# Patient Record
Sex: Female | Born: 1970 | Race: Black or African American | Hispanic: No | Marital: Married | Smoking: Never smoker
Health system: Southern US, Community
[De-identification: ages and names within clinical notes are randomized; demographics above are authoritative.]

## PROBLEM LIST (undated history)

## (undated) DIAGNOSIS — Z789 Other specified health status: Secondary | ICD-10-CM

## (undated) HISTORY — PX: ORTHOPEDIC SURGERY: SHX850

---

## 1999-01-06 ENCOUNTER — Emergency Department (HOSPITAL_COMMUNITY): Admission: EM | Admit: 1999-01-06 | Discharge: 1999-01-07 | Payer: Self-pay | Admitting: Emergency Medicine

## 2001-02-09 ENCOUNTER — Other Ambulatory Visit: Admission: RE | Admit: 2001-02-09 | Discharge: 2001-02-09 | Payer: Self-pay | Admitting: Obstetrics & Gynecology

## 2001-04-10 ENCOUNTER — Emergency Department (HOSPITAL_COMMUNITY): Admission: EM | Admit: 2001-04-10 | Discharge: 2001-04-11 | Payer: Self-pay | Admitting: Emergency Medicine

## 2001-08-25 ENCOUNTER — Inpatient Hospital Stay (HOSPITAL_COMMUNITY): Admission: AD | Admit: 2001-08-25 | Discharge: 2001-08-27 | Payer: Self-pay | Admitting: Obstetrics & Gynecology

## 2002-05-31 ENCOUNTER — Other Ambulatory Visit: Admission: RE | Admit: 2002-05-31 | Discharge: 2002-05-31 | Payer: Self-pay | Admitting: Obstetrics & Gynecology

## 2004-03-31 ENCOUNTER — Encounter: Admission: RE | Admit: 2004-03-31 | Discharge: 2004-03-31 | Payer: Self-pay | Admitting: Family Medicine

## 2005-01-13 ENCOUNTER — Inpatient Hospital Stay (HOSPITAL_COMMUNITY): Admission: AD | Admit: 2005-01-13 | Discharge: 2005-01-13 | Payer: Self-pay | Admitting: Obstetrics & Gynecology

## 2005-01-14 ENCOUNTER — Inpatient Hospital Stay (HOSPITAL_COMMUNITY): Admission: AD | Admit: 2005-01-14 | Discharge: 2005-01-16 | Payer: Self-pay | Admitting: Obstetrics and Gynecology

## 2005-01-14 ENCOUNTER — Encounter (INDEPENDENT_AMBULATORY_CARE_PROVIDER_SITE_OTHER): Payer: Self-pay | Admitting: Specialist

## 2005-09-07 ENCOUNTER — Encounter: Admission: RE | Admit: 2005-09-07 | Discharge: 2005-09-07 | Payer: Self-pay | Admitting: Family Medicine

## 2008-10-03 ENCOUNTER — Inpatient Hospital Stay (HOSPITAL_COMMUNITY): Admission: AD | Admit: 2008-10-03 | Discharge: 2008-10-05 | Payer: Self-pay | Admitting: Obstetrics & Gynecology

## 2010-07-04 ENCOUNTER — Emergency Department (HOSPITAL_BASED_OUTPATIENT_CLINIC_OR_DEPARTMENT_OTHER)
Admission: EM | Admit: 2010-07-04 | Discharge: 2010-07-05 | Payer: Self-pay | Source: Home / Self Care | Admitting: Emergency Medicine

## 2010-08-01 ENCOUNTER — Encounter: Payer: Self-pay | Admitting: Obstetrics & Gynecology

## 2010-10-22 LAB — CBC
HCT: 33.7 % — ABNORMAL LOW (ref 36.0–46.0)
HCT: 39.3 % (ref 36.0–46.0)
Hemoglobin: 11.1 g/dL — ABNORMAL LOW (ref 12.0–15.0)
Hemoglobin: 13 g/dL (ref 12.0–15.0)
MCHC: 32.9 g/dL (ref 30.0–36.0)
MCHC: 33.1 g/dL (ref 30.0–36.0)
MCV: 101.3 fL — ABNORMAL HIGH (ref 78.0–100.0)
Platelets: 214 10*3/uL (ref 150–400)
RBC: 3.89 MIL/uL (ref 3.87–5.11)
RDW: 14 % (ref 11.5–15.5)
RDW: 14.3 % (ref 11.5–15.5)
WBC: 12.2 10*3/uL — ABNORMAL HIGH (ref 4.0–10.5)

## 2010-11-24 NOTE — H&P (Signed)
Christine Archer, Christine Archer                ACCOUNT NO.:  [id number]   MEDICAL RECORD NO.:  [id number]          PATIENT TYPE:  INP   LOCATION:  9107                          FACILITY:  [facility]   PHYSICIAN:  Lenoard Aden, M.D.DATE OF BIRTH:  08-26-1970   DATE OF ADMISSION:  10/03/2008  DATE OF DISCHARGE:                              HISTORY & PHYSICAL   CHIEF COMPLAINT:  Labor.   HISTORY OF PRESENT ILLNESS:  She is a 40 year old African American  female G3, P2 at term who presents in active labor.  Medications are  prenatal vitamins and Valtrex for suppression.  She denies herpes  outbreaks.  She is a nonsmoker, nondrinker.  Denies domestic or physical  violence.   FAMILY HISTORY:  Diabetes, hypertension, breast cancer, lymphoma,  rheumatoid arthritis, lung cancer, CVA, history of two uncomplicated  vaginal deliveries.   PHYSICAL EXAMINATION:  GENERAL:  She is uncomfortable-appearing white  female in acute distress.  HEENT:  Normal.  LUNGS:  Clear.  HEART:  Regular rhythm.  ABDOMEN:  Soft, gravid, and nontender.  No CVA tenderness noted.  Cervix  is 10 cm, 100%, +2.  The patient precipitously delivering outside of the  room.  EXTREMITIES:  No cords.  NEUROLOGIC:  Nonfocal.  SKIN:  Intact.  As noted fetal heart tones are visualized to be within  the normal range during the course of the delivery.   IMPRESSION:  A 39-week intrauterine pregnancy and second stage of labor.   PLAN:  Anticipated attempts at vaginal delivery.      Lenoard Aden, M.D.  Electronically Signed     RJT/MEDQ  D:  10/03/2008  T:  10/04/2008  Job:  [id number]

## 2010-11-27 NOTE — H&P (Signed)
NAMEBRITT, Christine Archer                ACCOUNT NO.:  [id number]   MEDICAL RECORD NO.:  [id number]          PATIENT TYPE:  MAT   LOCATION:  MATC                          FACILITY:  [facility]   PHYSICIAN:  Lenoard Aden, M.D.DATE OF BIRTH:  23-Jun-1971   DATE OF ADMISSION:  01/13/2005  DATE OF DISCHARGE:                                HISTORY & PHYSICAL   CHIEF COMPLAINT:  Labor.   HISTORY OF PRESENT ILLNESS:  The patient is a 40 year old African-American  female, G5, P1, EDD January 16, 2005, at 39-5/7 weeks with spontaneous rupture  of membranes with leakage of fluids since approximately 11 o'clock this  morning.   ALLERGIES:  She has no known drug allergies.   MEDICATIONS:  Prenatal vitamins.   PAST OB HISTORY:  She has history of uncomplicated 7 pound 7 ounce female born  in 2003, TAB in 1998, TAB second trimester 1999, history of a TAB in 2001.   PAST MEDICAL HISTORY:  She has no other medical or surgical  hospitalizations.   FAMILY HISTORY:  She has a family history of insulin dependent diabetes,  cerebrovascular disease, lymphoma, and breast cancer.   LABORATORY DATA:  Prenatal lab data:  Blood type O positive.  Rh antibody  negative.  Rubella immune.  Hepatitis/HIV negative.  GBS negative.   PHYSICAL EXAMINATION:  GENERAL:  She is a well-developed, well-nourished,  African-American female in no acute distress.  HEENT:  Normal.  LUNGS:  Clear.  HEART:  Regular rhythm.  ABDOMEN:  Soft, gravid, nontender.  Estimated fetal weight 7-1/2 pounds.  CERVIX:  Cervix is 4 cm dilated, 50%, soft, vertex, -1, clear amniotic fluid  noted.  EXTREMITIES:  No cords.  NEUROLOGIC:  Nonfocal.   IMPRESSION:  Term intrauterine pregnancy in early labor, spontaneous rupture  of membranes, GBS negative.   PLAN:  Admit to [facility].  Anticipated attempts at vaginal delivery.  Epidural as needed.       RJT/MEDQ  D:  01/14/2005  T:  01/14/2005  Job:  [id number]   cc:   [facility]

## 2010-11-27 NOTE — H&P (Signed)
[facility]  Patient:    Christine Archer, Christine Archer Visit Number: [id number] MRN: [id number]          Service Type: OBS Location: 910B 9153 [id number] Attending Physician:  Genia Del Dictated by:   Genia Del, M.D. Admit Date:  08/25/2001                           History and Physical  DATE OF BIRTH:  1970/12/29.  BRIEF HISTORY:  The patient is a 40 year old G4, P0, A3, last menstrual period Nov 23, 2000, for an expected date of delivery August 31, 2001, at 39 weeks and 1 day gestation.  REASON FOR ADMISSION:  Spontaneous labor with regular uterine contractions every 3 to 4 minutes x 8:30 this morning.  HISTORY OF PRESENT ILLNESS:  INcreased uterine contractions x yesterday with regular uterine contractions of moderate intensity x 8:30 on August 25, 2001.  No fluid leak, no vaginal bleeding.  Good fetal movement.  No PIH symptoms.  PAST MEDICAL HISTORY:  Negative.  PAST SURGICAL HISTORY:  Positive for therapeutic abortions x 3.  No complications.  OBSTETRICAL HISTORY:  Therapeutic abortions x 3 in 1998, 1999, and 2001 at 8 weeks, 20 weeks and 13 weeks.  FAMILY HISTORY:  Positive for diabetes and breast cancer.  ALLERGIES:  No known drug allergies.  MEDICATIONS: 1. Prenatal vitamins.  HISTORY OF PRESENT PREGNANCY:  First trimester:  Mild spotting.  Labs on first trimester:  hemoglobin 11.9, platelets 315, blood type Rh 0 positive.  Rh antibodies negative.  Sickle cell trait negative.  RPR nonreactive.  Rubella titer is not immune.  HBsAg negative.  HIV nonreactive.  Gonorrhea and chlamydia negative.  At 16 weeks patient had triple test which was within normal limits.  At 19 plus weeks ultrasound revealed anatomy was within normal limits, placenta was fundal, posterior, normal.  Amniotic fluid index within normal limits.  Cervical length 4 cm closed.  In the third trimester 1 hour gtt. was within normal limits.  Group B streptococcus  was negative at 35+ weeks.  Blood pressures remained normal and uterine height appropriate for gestational age.  REVIEW OF SYSTEMS:  Constitutional negative.  HEENT:  Negative.  Respiratory, cardiovascular:  Negative.  Urologic/GI:  Negative.  Endocrine/Neurologic: Negative.  PHYSICAL EXAMINATION:  GENERAL:  Pain with uterine contractions.  VITAL SIGNS:  Stable with blood pressure at 115/73.  Pulse 82, temperature 97.7, respiratory rate 20.  LUNGS:  Clear bilaterally.  HEART:  Regular cardiac rhythm, no murmur.  ABDOMEN:  Gravid.  Uterus soft between contractions.  VAGINAL EXAMINATION:   At maternity admission was 2 cm, 70% percent effaced. Vertex minus 2, membranes intact.  Lower limbs normal.  Monitoring fetal heart rate 130-135 per minute with accelerations positive.   No decelerations. Uterine contractions every 2 to 4 minutes lasting about 60 seconds.  Good intensity.  IMPRESSION:  G4, P0, A3, at 39 plus weeks gestation in spontaneous labor. Fetal well-being reassuring.  Group B streptococcus negative.  PLAN:  Admit to labor and delivery monitoring.  Expectant management towards probable vaginal delivery. Dictated by:   Genia Del, M.D. Attending Physician:  Genia Del DD:  08/25/01 TD:  08/25/01 Job: [id number]

## 2010-11-27 NOTE — Op Note (Signed)
[name], Christine Archer                ACCOUNT NO.:  [id number]   MEDICAL RECORD NO.:  [id number]          PATIENT TYPE:  INP   LOCATION:  9171                          FACILITY:  [facility]   PHYSICIAN:  Richardean Sale, M.D.   DATE OF BIRTH:  Dec 08, 1970   DATE OF PROCEDURE:  01/14/2005  DATE OF DISCHARGE:                                 OPERATIVE REPORT   PREOPERATIVE DIAGNOSIS:  Nonreassuring fetal tracing, second stage.   POSTOPERATIVE DIAGNOSIS:  Nonreassuring fetal tracing, second stage.   PROCEDURE:  Low vacuum assisted vaginal delivery.   SURGEON:  Richardean Sale, M.D.   ANESTHESIA:  Epidural.   COMPLICATIONS:  None.   ESTIMATED BLOOD LOSS:  200 mL.   FINDINGS:  Viable female infant right occipitoanterior with Apgars of 9 and 9.  Arterial cord pH of 7.18.   INDICATIONS FOR PROCEDURE:  This is a 40 year old gravida 5, para 1-0-3-1,  female who presented at 39+ weeks gestation with spontaneous rupture of  membranes.  The patient was admitted at 4 cm dilated, received only 1  milliunit of Pitocin, and developed an adequate contraction pattern.  The  patient's fetal heart rate tracing on admission was in the 160's to 170's  with accelerations in the 180's to 190's.  She had a temperature of 100.  She was therefore started on intravenous antibiotics for presumed  chorioamnionitis.  Group B Strep was known negative.  The patient made  adequate progress in labor.  At 8 cm, the fetal heart rate tracing was in  the 160's with the development of moderate variable decelerations.  The  patient progressed quickly to an anterior lip with a +1 station.  The  anterior lip was able to be reduced with one contraction.  Fetal heart rate  tracing remained at a baseline of 160's.  She did have an episode of  bradycardia down to the 70's.  Given this finding, the decision was made to  proceed with vacuum assisted vaginal delivery for nonreassuring fetal heart  rate tracing.  Prior to the procedure,  the risks, benefits, and alternatives  were reviewed with the patient and her husband in detail and verbal consent  was obtained.   DESCRIPTION OF PROCEDURE:  The patient's Foley catheter had been removed  just prior to the onset of pushing.  A vaginal examination was performed  which revealed the patient was complete, complete, at +2 station, right  occipitoanterior.  The Kiwi vacuum was then applied over the flexion point  and vacuum pressure was applied to the green safety zone with the next  contraction.  The vertex was delivered to crowning.  The vacuum was  discontinued.  A midline episiotomy was then made and the head was then  delivered atraumatically.  Nose and mouth were suctioned with the bulb.  Shoulders and body were delivered within 5 to 10 seconds and the infant was  delivered to the mother's abdomen with a vigorous cry.  There was no nuchal  cord, but a short umbilical cord was noted.  The cord was clamped and cut.  The placenta was then delivered  spontaneously and intact.  The uterus was  explored.  There were no retained products .  Cervix was visualized and was  intact.  Second degree midline  episiotomy was repaired with 3-0 Vicryl in the standard fashion and was  hemostatic.  Estimated blood loss was 200 mL.  Arterial cord pH was  obtained.  The patient tolerated the procedure very well.  She and the  infant are doing well at the time of this dictation.  There were no  complications.  All sponge and instrument counts were correct.       JW/MEDQ  D:  01/14/2005  T:  01/14/2005  Job:  [id number]

## 2010-11-27 NOTE — Consult Note (Signed)
Christine Archer, Christine Archer                ACCOUNT NO.:  [id number]   MEDICAL RECORD NO.:  [id number]           PATIENT TYPE:   LOCATION:                                [facility]ACILITY:  [facility]   PHYSICIAN:  Lenoard Aden, M.D.DATE OF BIRTH:  Jul 27, 1970   DATE OF CONSULTATION:  01/13/2005  DATE OF DISCHARGE:                                   CONSULTATION   CONSULTING PHYSICIAN:  Lenoard Aden, M.D.   CHIEF COMPLAINT:  Rule out rupture of membranes.   HISTORY OF PRESENT ILLNESS:  The patient is a 40 year old, African American  female, G5, P1-0-3-1, EDD of January 16, 2005, at 39+ weeks, who presents with  questionable leakage of fluid today.   She has no known drug allergies.   MEDICATIONS:  Prenatal vitamins.   History of TAB x 3 and spontaneous vaginal delivery x 1.  Pregnancy course  uncomplicated.   FAMILY HISTORY:  Remarkable for breast cancer, lymphoma, cerebrovascular  accident, and insulin-dependent diabetes.   She is a nonsmoker, nondrinker.  Denies domestic or physical violence.   Blood type O positive.  Rubella immune.  Hepatitis, HIV negative.  Triple  screen normal.  Glucose test normal.   PHYSICAL EXAMINATION:  GENERAL:  She is a well-developed, well-nourished  white female in no acute distress.  HEENT:  Normal.  LUNGS:  Clear.  HEART:  Regular rate and rhythm.  ABDOMEN:  Soft, gravid, nontender.  PELVIC:  Cervix is 3-cm, 50%, vertex -1.  Speculum exam negative.  __________  negative.  Membranes are palpable.   IMPRESSION:  1.  A 39-week obstetrical patient.  2.  No evidence of spontaneous rupture of membranes.   PLAN:  1.  Discharge home.  2.  Labor warnings given.  3.  Anticipate followup.  4.  If further leakage of fluid is noted the patient to return for re-      evaluation.       RJT/MEDQ  D:  01/13/2005  T:  01/13/2005  Job:  [id number]

## 2011-05-27 ENCOUNTER — Emergency Department (HOSPITAL_COMMUNITY): Payer: 59 | Admitting: Anesthesiology

## 2011-05-27 ENCOUNTER — Emergency Department (HOSPITAL_COMMUNITY): Payer: 59

## 2011-05-27 ENCOUNTER — Encounter (HOSPITAL_COMMUNITY): Payer: Self-pay | Admitting: Anesthesiology

## 2011-05-27 ENCOUNTER — Encounter (HOSPITAL_COMMUNITY)
Admission: EM | Disposition: A | Discharge status: Home Health | Payer: Self-pay | Source: Ambulatory Visit | Attending: Orthopedic Surgery

## 2011-05-27 ENCOUNTER — Encounter: Payer: Self-pay | Admitting: Emergency Medicine

## 2011-05-27 ENCOUNTER — Inpatient Hospital Stay (HOSPITAL_COMMUNITY)
Admission: EM | Admit: 2011-05-27 | Discharge: 2011-05-29 | DRG: 494 | Disposition: A | Discharge status: Home Health | Payer: 59 | Source: Ambulatory Visit | Attending: Orthopedic Surgery | Admitting: Orthopedic Surgery

## 2011-05-27 DIAGNOSIS — S82202B Unspecified fracture of shaft of left tibia, initial encounter for open fracture type I or II: Secondary | ICD-10-CM | POA: Diagnosis present

## 2011-05-27 DIAGNOSIS — S82402B Unspecified fracture of shaft of left fibula, initial encounter for open fracture type I or II: Secondary | ICD-10-CM

## 2011-05-27 DIAGNOSIS — S82209B Unspecified fracture of shaft of unspecified tibia, initial encounter for open fracture type I or II: Principal | ICD-10-CM | POA: Diagnosis present

## 2011-05-27 DIAGNOSIS — Y92009 Unspecified place in unspecified non-institutional (private) residence as the place of occurrence of the external cause: Secondary | ICD-10-CM

## 2011-05-27 DIAGNOSIS — Y998 Other external cause status: Secondary | ICD-10-CM

## 2011-05-27 HISTORY — PX: TIBIA IM NAIL INSERTION: SHX2516

## 2011-05-27 HISTORY — PX: I & D EXTREMITY: SHX5045

## 2011-05-27 HISTORY — DX: Other specified health status: Z78.9

## 2011-05-27 LAB — BASIC METABOLIC PANEL
GFR calc Af Amer: >90 mL/min (ref >90)
GFR calc non Af Amer: >90 mL/min (ref >90)
Potassium: 4.3 mEq/L (ref 3.5–5.1)
Sodium: 138 mEq/L (ref 135–145)

## 2011-05-27 LAB — CBC
MCH: 30.9 pg (ref 26.0–34.0)
MCHC: 32.7 g/dL (ref 30.0–36.0)
Platelets: 234 10*3/uL (ref 150–400)
RBC: 3.92 MIL/uL (ref 3.87–5.11)

## 2011-05-27 LAB — APTT: aPTT: 26 seconds (ref 24–37)

## 2011-05-27 LAB — PROTIME-INR: INR: 1.07 (ref 0.00–1.49)

## 2011-05-27 LAB — DIFFERENTIAL
Basophils Relative: 0 % (ref 0–1)
Eosinophils Absolute: 0 10*3/uL (ref 0.0–0.7)
Neutrophils Relative %: 89 % — ABNORMAL HIGH (ref 43–77)

## 2011-05-27 SURGERY — INSERTION, INTRAMEDULLARY ROD, TIBIA
Anesthesia: General | Site: Leg Lower | Laterality: Left | Wound class: Dirty or Infected

## 2011-05-27 MED ORDER — PROMETHAZINE HCL 25 MG PO TABS: 25.0000 mg | ORAL_TABLET | Freq: Four times a day (QID) | ORAL | Status: AC | PRN

## 2011-05-27 MED ORDER — METHOCARBAMOL 500 MG PO TABS: 500.0000 mg | ORAL_TABLET | Freq: Four times a day (QID) | ORAL | Status: DC

## 2011-05-27 MED ORDER — FENTANYL CITRATE 0.05 MG/ML IJ SOLN
INTRAMUSCULAR | Status: DC | PRN
  Administered 2011-05-27 (×2): 50 ug via INTRAVENOUS
  Administered 2011-05-27: 100 ug via INTRAVENOUS
  Administered 2011-05-27: 50 ug via INTRAVENOUS

## 2011-05-27 MED ORDER — ONDANSETRON HCL 4 MG/2ML IJ SOLN
4.0000 mg | Freq: Once | INTRAMUSCULAR | Status: AC
  Administered 2011-05-27: 4 mg via INTRAVENOUS
  Filled 2011-05-27: qty 2

## 2011-05-27 MED ORDER — ENOXAPARIN SODIUM 30 MG/0.3ML ~~LOC~~ SOLN: 40.0000 mg | SUBCUTANEOUS | Status: DC

## 2011-05-27 MED ORDER — METHOCARBAMOL 500 MG PO TABS: 500.0000 mg | ORAL_TABLET | Freq: Four times a day (QID) | ORAL | Status: AC

## 2011-05-27 MED ORDER — ONDANSETRON HCL 4 MG/2ML IJ SOLN
INTRAMUSCULAR | Status: AC
  Administered 2011-05-27: 4 mg via INTRAVENOUS
  Filled 2011-05-27: qty 2

## 2011-05-27 MED ORDER — ONDANSETRON HCL 4 MG/2ML IJ SOLN
INTRAMUSCULAR | Status: DC | PRN
  Administered 2011-05-27: 4 mg via INTRAVENOUS

## 2011-05-27 MED ORDER — CEFAZOLIN SODIUM 1-5 GM-% IV SOLN
1.0000 g | Freq: Once | INTRAVENOUS | Status: AC
  Administered 2011-05-27: 1 g via INTRAVENOUS

## 2011-05-27 MED ORDER — PROPOFOL 10 MG/ML IV EMUL
INTRAVENOUS | Status: DC | PRN
  Administered 2011-05-27: 170 mg via INTRAVENOUS

## 2011-05-27 MED ORDER — CEFAZOLIN SODIUM 1-5 GM-% IV SOLN
1.0000 g | Freq: Three times a day (TID) | INTRAVENOUS | Status: DC
  Administered 2011-05-28 – 2011-05-29 (×4): 1 g via INTRAVENOUS
  Filled 2011-05-27 (×7): qty 50

## 2011-05-27 MED ORDER — MIDAZOLAM HCL 5 MG/5ML IJ SOLN
INTRAMUSCULAR | Status: DC | PRN
  Administered 2011-05-27: 2 mg via INTRAVENOUS

## 2011-05-27 MED ORDER — HYDROMORPHONE HCL PF 1 MG/ML IJ SOLN
1.0000 mg | Freq: Once | INTRAMUSCULAR | Status: AC
  Administered 2011-05-27: 1 mg via INTRAVENOUS
  Filled 2011-05-27: qty 1

## 2011-05-27 MED ORDER — OXYCODONE-ACETAMINOPHEN 10-325 MG PO TABS: 1.0000 | ORAL_TABLET | Freq: Four times a day (QID) | ORAL | Status: AC | PRN

## 2011-05-27 MED ORDER — ONDANSETRON HCL 4 MG/2ML IJ SOLN
4.0000 mg | Freq: Once | INTRAMUSCULAR | Status: AC
  Administered 2011-05-27: 4 mg via INTRAVENOUS

## 2011-05-27 MED ORDER — PROMETHAZINE HCL 25 MG/ML IJ SOLN: 6.2500 mg | INTRAMUSCULAR | Status: DC | PRN

## 2011-05-27 MED ORDER — SODIUM CHLORIDE 0.9 % IR SOLN
Status: DC | PRN
  Administered 2011-05-27: 6000 mL

## 2011-05-27 MED ORDER — HYDROMORPHONE HCL PF 1 MG/ML IJ SOLN
0.2500 mg | INTRAMUSCULAR | Status: DC | PRN
  Administered 2011-05-27: 0.5 mg via INTRAVENOUS

## 2011-05-27 MED ORDER — SUCCINYLCHOLINE CHLORIDE 20 MG/ML IJ SOLN
INTRAMUSCULAR | Status: DC | PRN
  Administered 2011-05-27: 100 mg via INTRAVENOUS

## 2011-05-27 MED ORDER — CEFAZOLIN SODIUM 1-5 GM-% IV SOLN
INTRAVENOUS | Status: DC | PRN
  Administered 2011-05-27: 1 g via INTRAVENOUS

## 2011-05-27 MED ORDER — LACTATED RINGERS IV SOLN
INTRAVENOUS | Status: DC | PRN
  Administered 2011-05-27 (×2): via INTRAVENOUS

## 2011-05-27 MED ORDER — ACETAMINOPHEN 10 MG/ML IV SOLN
1000.0000 mg | Freq: Once | INTRAVENOUS | Status: AC
  Administered 2011-05-27: 1000 mg via INTRAVENOUS
  Filled 2011-05-27: qty 100

## 2011-05-27 MED ORDER — PROMETHAZINE HCL 25 MG PO TABS
25.0000 mg | ORAL_TABLET | Freq: Four times a day (QID) | ORAL | Status: DC | PRN
Start: 2011-05-27 — End: 2011-05-27

## 2011-05-27 SURGICAL SUPPLY — 75 items
APL SKNCLS STERI-STRIP NONHPOA (GAUZE/BANDAGES/DRESSINGS) ×1
BANDAGE ELASTIC 4 VELCRO ST LF (GAUZE/BANDAGES/DRESSINGS) ×2 IMPLANT
BANDAGE ELASTIC 6 VELCRO ST LF (GAUZE/BANDAGES/DRESSINGS) ×2 IMPLANT
BANDAGE ESMARK 6X9 LF (GAUZE/BANDAGES/DRESSINGS) IMPLANT
BANDAGE GAUZE ELAST BULKY 4 IN (GAUZE/BANDAGES/DRESSINGS) ×2 IMPLANT
BENZOIN TINCTURE PRP APPL 2/3 (GAUZE/BANDAGES/DRESSINGS) ×1 IMPLANT
BIT DRILL 3.8X6 NS (BIT) ×1 IMPLANT
BIT DRILL 4.4 NS (BIT) ×1 IMPLANT
BLADE SURG 15 STRL LF DISP TIS (BLADE) ×1 IMPLANT
BLADE SURG 15 STRL SS (BLADE) ×2
BLADE SURG ROTATE 9660 (MISCELLANEOUS) IMPLANT
BNDG CMPR 9X6 STRL LF SNTH (GAUZE/BANDAGES/DRESSINGS)
BNDG COHESIVE 6X5 TAN STRL LF (GAUZE/BANDAGES/DRESSINGS) ×2 IMPLANT
BNDG ESMARK 6X9 LF (GAUZE/BANDAGES/DRESSINGS)
BOOTCOVER CLEANROOM LRG (PROTECTIVE WEAR) ×4 IMPLANT
CLOSURE STERI STRIP 1/2 X4 (GAUZE/BANDAGES/DRESSINGS) ×1 IMPLANT
CLOTH BEACON ORANGE TIMEOUT ST (SAFETY) ×2 IMPLANT
COVER SURGICAL LIGHT HANDLE (MISCELLANEOUS) ×3 IMPLANT
CUFF TOURNIQUET SINGLE 34IN LL (TOURNIQUET CUFF) IMPLANT
CUFF TOURNIQUET SINGLE 44IN (TOURNIQUET CUFF) IMPLANT
DRAPE C-ARM 42X72 X-RAY (DRAPES) ×2 IMPLANT
DRAPE C-ARMOR (DRAPES) ×1 IMPLANT
DRAPE ORTHO SPLIT 77X108 STRL (DRAPES) ×6
DRAPE PROXIMA HALF (DRAPES) ×4 IMPLANT
DRAPE SURG ORHT 6 SPLT 77X108 (DRAPES) ×2 IMPLANT
DRAPE U-SHAPE 47X51 STRL (DRAPES) ×2 IMPLANT
DRSG ADAPTIC 3X8 NADH LF (GAUZE/BANDAGES/DRESSINGS) ×2 IMPLANT
DRSG PAD ABDOMINAL 8X10 ST (GAUZE/BANDAGES/DRESSINGS) ×1 IMPLANT
DURAPREP 26ML APPLICATOR (WOUND CARE) ×1 IMPLANT
ELECT REM PT RETURN 9FT ADLT (ELECTROSURGICAL) ×2
ELECTRODE REM PT RTRN 9FT ADLT (ELECTROSURGICAL) ×1 IMPLANT
FACESHIELD LNG OPTICON STERILE (SAFETY) ×2 IMPLANT
FLUID NSS /IRRIG 3000 ML XXX (IV SOLUTION) ×2 IMPLANT
GAUZE SPONGE 4X4 12PLY STRL LF (GAUZE/BANDAGES/DRESSINGS) ×1 IMPLANT
GAUZE XEROFORM 5X9 LF (GAUZE/BANDAGES/DRESSINGS) ×1 IMPLANT
GLOVE BIOGEL PI IND STRL 8 (GLOVE) ×1 IMPLANT
GLOVE BIOGEL PI INDICATOR 8 (GLOVE) ×2
GLOVE ORTHO TXT STRL SZ7.5 (GLOVE) ×4 IMPLANT
GLOVE SURG ORTHO 8.0 STRL STRW (GLOVE) ×5 IMPLANT
GOWN STRL NON-REIN LRG LVL3 (GOWN DISPOSABLE) IMPLANT
GUIDEWIRE BALL NOSE 80CM (WIRE) ×1 IMPLANT
HANDPIECE INTERPULSE COAX TIP (DISPOSABLE) ×2
KIT BASIN OR (CUSTOM PROCEDURE TRAY) ×2 IMPLANT
KIT ROOM TURNOVER OR (KITS) ×2 IMPLANT
MANIFOLD NEPTUNE II (INSTRUMENTS) ×2 IMPLANT
NAIL TIBIAL 9MMX34.5CM (Nail) ×1 IMPLANT
NS IRRIG 1000ML POUR BTL (IV SOLUTION) ×2 IMPLANT
PACK GENERAL/GYN (CUSTOM PROCEDURE TRAY) ×2 IMPLANT
PAD CAST 4YDX4 CTTN HI CHSV (CAST SUPPLIES) IMPLANT
PADDING CAST COTTON 4X4 STRL (CAST SUPPLIES) ×2
PADDING CAST COTTON 6X4 STRL (CAST SUPPLIES) ×1 IMPLANT
SCREW ACECAP 32MM (Screw) ×1 IMPLANT
SCREW ACECAP 38MM (Screw) ×1 IMPLANT
SCREW ACECAP 42MM (Screw) ×1 IMPLANT
SCREW CORTICAL 5.5 35MM (Screw) ×1 IMPLANT
SCREW PROXIMAL DEPUY (Screw) ×2 IMPLANT
SCREW PRXML FT 60X5.5XNS LF (Screw) IMPLANT
SET HNDPC FAN SPRY TIP SCT (DISPOSABLE) IMPLANT
SPONGE GAUZE 4X4 12PLY (GAUZE/BANDAGES/DRESSINGS) ×4 IMPLANT
STAPLER VISISTAT 35W (STAPLE) ×2 IMPLANT
STOCKINETTE IMPERVIOUS LG (DRAPES) ×2 IMPLANT
SUCTION FRAZIER TIP 10 FR DISP (SUCTIONS) ×2 IMPLANT
SUT ETHILON 3 0 PS 1 (SUTURE) ×2 IMPLANT
SUT MNCRL AB 4-0 PS2 18 (SUTURE) ×2 IMPLANT
SUT VIC AB 0 CT1 18XCR BRD 8 (SUTURE) ×1 IMPLANT
SUT VIC AB 0 CT1 8-18 (SUTURE) ×2
SUT VIC AB 2-0 CT1 27 (SUTURE)
SUT VIC AB 2-0 CT1 TAPERPNT 27 (SUTURE) ×1 IMPLANT
SUT VIC AB 3-0 SH 8-18 (SUTURE) ×2 IMPLANT
TOWEL OR 17X24 6PK STRL BLUE (TOWEL DISPOSABLE) ×2 IMPLANT
TOWEL OR 17X26 10 PK STRL BLUE (TOWEL DISPOSABLE) ×2 IMPLANT
TRAY FOLEY CATH 14FR (SET/KITS/TRAYS/PACK) ×1 IMPLANT
TUBE CONNECTING 12X1/4 (SUCTIONS) ×2 IMPLANT
WATER STERILE IRR 1000ML POUR (IV SOLUTION) ×2 IMPLANT
YANKAUER SUCT BULB TIP NO VENT (SUCTIONS) ×2 IMPLANT

## 2011-05-27 NOTE — ED Provider Notes (Signed)
Medical screening examination/treatment/procedure(s) were performed by non-physician practitioner and as supervising physician I was immediately available for consultation/collaboration.  Marlowe Lawes, MD 05/27/11 2126 

## 2011-05-27 NOTE — ED Notes (Signed)
Pt states pain relieved, denies needs. Aware of plans to go from here to OR. [facility] nurse Selena Batten called with report but she was told OR staff was coming to ER for pt and then she will go to 5000 after surgery.

## 2011-05-27 NOTE — ED Notes (Signed)
Report given to or nurse and she is aware needs consent form signed by pt and md.
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
## 2011-05-27 NOTE — Anesthesia Preprocedure Evaluation (Addendum)
Anesthesia Evaluation  Patient identified by MRN, date of birth, ID band Patient awake    Reviewed: Allergy & Precautions, H&P , NPO status , Patient's Chart, lab work & pertinent test results  Airway Mallampati: I TM Distance: >3 FB Neck ROM: Full  Mouth opening: Limited Mouth Opening  Dental  (+) Teeth Intact   Pulmonary    Pulmonary exam normal       Cardiovascular     Neuro/Psych    GI/Hepatic   Endo/Other    Renal/GU      Musculoskeletal   Abdominal   Peds  Hematology   Anesthesia Other Findings   Reproductive/Obstetrics                         Anesthesia Physical Anesthesia Plan  ASA: I and Emergent  Anesthesia Plan: General   Post-op Pain Management:    Induction: Intravenous  Airway Management Planned: Oral ETT  Additional Equipment:   Intra-op Plan:   Post-operative Plan: Extubation in OR  Informed Consent: I have reviewed the patients History and Physical, chart, labs and discussed the procedure including the risks, benefits and alternatives for the proposed anesthesia with the patient or authorized representative who has indicated his/her understanding and acceptance.   Dental advisory given  Plan Discussed with: CRNA, Anesthesiologist and Surgeon  Anesthesia Plan Comments:        Anesthesia Quick Evaluation

## 2011-05-27 NOTE — Transfer of Care (Signed)
Immediate Anesthesia Transfer of Care Note  Patient: Christine Archer  Procedure(s) Performed:  INTRAMEDULLARY (IM) NAIL TIBIAL; IRRIGATION AND DEBRIDEMENT EXTREMITY  Patient Location: PACU  Anesthesia Type: General  Level of Consciousness: awake and sedated  Airway & Oxygen Therapy: Patient Spontanous Breathing and Patient connected to nasal cannula oxygen  Post-op Assessment: Report given to PACU RN and Post -op Vital signs reviewed and stable  Post vital signs: Reviewed and stable  Complications: No apparent anesthesia complications

## 2011-05-27 NOTE — Discharge Instructions (Signed)
Tibial and Fibular Fracture, Adult You have a fracture (break in bone) of your tibia and fibula. The tibia is the large "shin" bone in your lower leg. It is the main bone for supporting your weight. The fibula is the bone on the outer side of your leg that makes up the bump on the outside of your ankle. These fractures are easily diagnosed with x-rays. TREATMENT  You have a simple fracture which usually will heal with minimal disability. It can be treated with simple immobilization. This means the bones can be held with a cast or splint in a favorable position until your caregiver feels it is stable enough (healed well enough) to allow weight bearing. Then you can begin range of motion exercises to regain your knee motion. HOME CARE INSTRUCTIONS   Apply ice to the injury for 15 to 20 minutes, 3 to 4 times per day while awake, for 2 days. Put the ice in a plastic bag and place a thin towel between the bag of ice and your cast.   If you have a plaster or fiberglass cast:   Do not try to scratch the skin under the cast using sharp or pointed objects.   Check the skin around the cast every day. You may put lotion on any red or sore areas.   Keep your cast dry and clean.   If you have a plaster splint:   Wear the splint as directed.   You may loosen the elastic around the splint if your toes become numb, tingle, or turn cold or blue.   Do not put pressure on any part of your cast or splint until it is fully hardened, because it may deform.   Your cast or splint can be protected during bathing with a plastic bag. Do not lower the cast or splint into water.   Use crutches as directed.   Only take over-the-counter or prescription medicines for pain, discomfort, or fever as directed by your caregiver.   Follow all instructions given to you by your caregiver, make and keep follow up appointments, and use crutches as directed.   See your caregiver as directed. It is very important to keep all  follow-up referrals and appointments in order to avoid any long-term problems with your leg and ankle including chronic pain, inability to move the ankle normally, failure of the fracture to heal and permanent disability.  SEEK IMMEDIATE MEDICAL CARE IF:  Pain is becoming worse rather than better, or if pain is uncontrolled with medications.   You have increased swelling or redness in the foot.   You begin to lose feeling in your foot or toes.   You develop a cold or blue foot or toes on the injured side.   You develop severe pain in your injured leg, especially if the pain is increased with movement of your toes.  Document Released: 03/20/2002 Document Revised: 03/10/2011 Document Reviewed: 10/14/2008 [facility] Patient Information 2012 [place], [place].

## 2011-05-27 NOTE — Preoperative (Signed)
Beta Blockers   Reason not to administer Beta Blockers:Not Applicable 

## 2011-05-27 NOTE — Anesthesia Procedure Notes (Addendum)
Performed by: Julianne Rice Z   Procedure Name: Intubation Date/Time: 05/27/2011 7:57 PM Performed by: Pricilla Holm Pre-anesthesia Checklist: Patient identified, Emergency Drugs available, Suction available, Patient being monitored and Timeout performed Patient Re-evaluated:Patient Re-evaluated prior to inductionOxygen Delivery Method: Circle System Utilized Preoxygenation: Pre-oxygenation with 100% oxygen Intubation Type: Rapid sequence Laryngoscope Size: Mac and 3 Grade View: Grade I Tube type: Oral Tube size: 7.5 mm Number of attempts: 1 Airway Equipment and Method: stylet Placement Confirmation: ETT inserted through vocal cords under direct vision,  positive ETCO2 and breath sounds checked- equal and bilateral Secured at: 23 cm Dental Injury: Teeth and Oropharynx as per pre-operative assessment

## 2011-05-27 NOTE — Brief Op Note (Signed)
05/27/2011  10:20 PM  PATIENT:  Christine Archer  40 y.o. female  PRE-OPERATIVE DIAGNOSIS:  open left tibia and fibula fracture  POST-OPERATIVE DIAGNOSIS:  Same  PROCEDURE:  INTRAMEDULLARY (IM) NAIL TIBIAL, IRRIGATION AND DEBRIDEMENT EXTREMITY  SURGEON:  Jamilex Bohnsack P  PHYSICIAN ASSISTANT: Janace Litten, OPA-C  ANESTHESIA:   General
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
## 2011-05-27 NOTE — ED Notes (Signed)
Pain down to 3/10

## 2011-05-27 NOTE — Anesthesia Postprocedure Evaluation (Signed)
  Anesthesia Post-op Note  Patient: Christine Archer  Procedure(s) Performed:  INTRAMEDULLARY (IM) NAIL TIBIAL; IRRIGATION AND DEBRIDEMENT EXTREMITY  Patient Location: PACU  Anesthesia Type: General  Level of Consciousness: awake, alert  and oriented  Airway and Oxygen Therapy: Patient Spontanous Breathing  Post-op Pain: mild  Post-op Assessment: Post-op Vital signs reviewed, Patient's Cardiovascular Status Stable, Respiratory Function Stable, Patent Airway, No signs of Nausea or vomiting and Pain level controlled  Post-op Vital Signs: stable  Complications: No apparent anesthesia complications

## 2011-05-28 LAB — BASIC METABOLIC PANEL
BUN: 10 mg/dL (ref 6–23)
Calcium: 8.8 mg/dL (ref 8.4–10.5)
Chloride: 101 mEq/L (ref 96–112)
Creatinine, Ser: 0.68 mg/dL (ref 0.50–1.10)
GFR calc Af Amer: >90 mL/min (ref >90)

## 2011-05-28 LAB — CBC
HCT: 30.8 % — ABNORMAL LOW (ref 36.0–46.0)
MCH: 31.1 pg (ref 26.0–34.0)
MCV: 93.1 fL (ref 78.0–100.0)
RDW: 12.5 % (ref 11.5–15.5)
WBC: 12.2 10*3/uL — ABNORMAL HIGH (ref 4.0–10.5)

## 2011-05-28 MED ORDER — ONDANSETRON HCL 4 MG/2ML IJ SOLN: 4.0000 mg | Freq: Four times a day (QID) | INTRAMUSCULAR | Status: DC | PRN

## 2011-05-28 MED ORDER — METOCLOPRAMIDE HCL 5 MG/ML IJ SOLN
5.0000 mg | Freq: Three times a day (TID) | INTRAMUSCULAR | Status: DC | PRN
  Filled 2011-05-28: qty 2

## 2011-05-28 MED ORDER — FLEET ENEMA 7-19 GM/118ML RE ENEM: 1.0000 | ENEMA | Freq: Every day | RECTAL | Status: DC | PRN

## 2011-05-28 MED ORDER — OXYCODONE-ACETAMINOPHEN 5-325 MG PO TABS
1.0000 | ORAL_TABLET | ORAL | Status: DC | PRN
  Administered 2011-05-28 – 2011-05-29 (×4): 2 via ORAL
  Filled 2011-05-28: qty 2
  Filled 2011-05-28 (×2): qty 1
  Filled 2011-05-28 (×2): qty 2

## 2011-05-28 MED ORDER — POTASSIUM CHLORIDE IN NACL 20-0.45 MEQ/L-% IV SOLN
INTRAVENOUS | Status: DC
  Administered 2011-05-28: 02:00:00 via INTRAVENOUS
  Filled 2011-05-28 (×2): qty 1000

## 2011-05-28 MED ORDER — SENNOSIDES-DOCUSATE SODIUM 8.6-50 MG PO TABS: 1.0000 | ORAL_TABLET | Freq: Every evening | ORAL | Status: DC | PRN

## 2011-05-28 MED ORDER — OXYCODONE-ACETAMINOPHEN 5-325 MG PO TABS: 1.0000 | ORAL_TABLET | ORAL | Status: DC | PRN

## 2011-05-28 MED ORDER — METHOCARBAMOL 100 MG/ML IJ SOLN
500.0000 mg | Freq: Four times a day (QID) | INTRAVENOUS | Status: DC | PRN
  Filled 2011-05-28: qty 5

## 2011-05-28 MED ORDER — POLYETHYLENE GLYCOL 3350 17 G PO PACK
17.0000 g | PACK | Freq: Every day | ORAL | Status: DC | PRN
  Filled 2011-05-28: qty 1

## 2011-05-28 MED ORDER — BISACODYL 10 MG RE SUPP: 10.0000 mg | Freq: Every day | RECTAL | Status: DC | PRN

## 2011-05-28 MED ORDER — METHOCARBAMOL 500 MG PO TABS
500.0000 mg | ORAL_TABLET | Freq: Four times a day (QID) | ORAL | Status: DC | PRN
Start: 2011-05-28 — End: 2011-05-29

## 2011-05-28 MED ORDER — ACETAMINOPHEN 325 MG PO TABS: 650.0000 mg | ORAL_TABLET | Freq: Four times a day (QID) | ORAL | Status: DC | PRN

## 2011-05-28 MED ORDER — METOCLOPRAMIDE HCL 10 MG PO TABS
5.0000 mg | ORAL_TABLET | Freq: Three times a day (TID) | ORAL | Status: DC | PRN
  Administered 2011-05-28: 10 mg via ORAL
  Filled 2011-05-28: qty 1

## 2011-05-28 MED ORDER — MAGNESIUM HYDROXIDE 400 MG/5ML PO SUSP: 30.0000 mL | Freq: Two times a day (BID) | ORAL | Status: DC | PRN

## 2011-05-28 MED ORDER — HYDROMORPHONE HCL PF 1 MG/ML IJ SOLN
INTRAMUSCULAR | Status: AC
  Administered 2011-05-28: 1 mg via INTRAVENOUS
  Filled 2011-05-28: qty 1

## 2011-05-28 MED ORDER — POTASSIUM CHLORIDE IN NACL 20-0.45 MEQ/L-% IV SOLN
INTRAVENOUS | Status: DC
  Filled 2011-05-28 (×3): qty 1000

## 2011-05-28 MED ORDER — PHENOL 1.4 % MT LIQD
1.0000 | OROMUCOSAL | Status: DC | PRN
  Filled 2011-05-28: qty 177

## 2011-05-28 MED ORDER — DOCUSATE SODIUM 100 MG PO CAPS
100.0000 mg | ORAL_CAPSULE | Freq: Two times a day (BID) | ORAL | Status: DC
  Administered 2011-05-28 – 2011-05-29 (×4): 100 mg via ORAL
  Filled 2011-05-28 (×4): qty 1

## 2011-05-28 MED ORDER — ENOXAPARIN SODIUM 40 MG/0.4ML ~~LOC~~ SOLN
40.0000 mg | SUBCUTANEOUS | Status: DC
  Administered 2011-05-28 – 2011-05-29 (×2): 40 mg via SUBCUTANEOUS
  Filled 2011-05-28 (×3): qty 0.4

## 2011-05-28 MED ORDER — CEPHALEXIN 500 MG PO CAPS: 500.0000 mg | ORAL_CAPSULE | Freq: Four times a day (QID) | ORAL | Status: AC

## 2011-05-28 MED ORDER — WHITE PETROLATUM GEL
Status: AC
  Filled 2011-05-28: qty 5

## 2011-05-28 MED ORDER — ACETAMINOPHEN 650 MG RE SUPP: 650.0000 mg | Freq: Four times a day (QID) | RECTAL | Status: DC | PRN

## 2011-05-28 MED ORDER — MENTHOL 3 MG MT LOZG: 1.0000 | LOZENGE | OROMUCOSAL | Status: DC | PRN

## 2011-05-28 MED ORDER — HYDROMORPHONE HCL PF 1 MG/ML IJ SOLN
1.0000 mg | INTRAMUSCULAR | Status: DC | PRN
  Administered 2011-05-28 (×2): 1 mg via INTRAVENOUS

## 2011-05-28 MED ORDER — BISACODYL 5 MG PO TBEC: 10.0000 mg | DELAYED_RELEASE_TABLET | Freq: Every day | ORAL | Status: DC | PRN

## 2011-05-28 MED ORDER — ZOLPIDEM TARTRATE 5 MG PO TABS: 5.0000 mg | ORAL_TABLET | Freq: Every evening | ORAL | Status: DC | PRN

## 2011-05-28 MED ORDER — ACETAMINOPHEN 325 MG PO TABS: 325.0000 mg | ORAL_TABLET | Freq: Four times a day (QID) | ORAL | Status: DC | PRN

## 2011-05-28 NOTE — Progress Notes (Signed)
Spoke with patient regarding Home health needs and equipment. Choice offered, and entered in [facility].

## 2011-05-28 NOTE — Progress Notes (Signed)
Physical Therapy Evaluation Patient Details Name: Christine Archer MRN: [id number] DOB: 1971/02/18 Today's Date: 05/28/2011  Problem List:  Patient Active Problem List  Diagnoses  . Open fracture of left tibia and fibula    Past Medical History:  Past Medical History  Diagnosis Date  . No pertinent past medical history    Past Surgical History: No past surgical history on file.  PT Assessment/Plan/Recommendation PT Assessment Clinical Impression Statement: Pt presents with a medical diagnosis of open tib/fib fx along with the impairments/deficits listed below. Pt will benefit from skilled PT in the acute care setting in order to maximize functional mobility for a safe d/c home.  PT Recommendation/Assessment: Patient will need skilled PT in the acute care venue PT Problem List: Decreased strength;Decreased range of motion;Decreased activity tolerance;Decreased mobility;Decreased knowledge of use of DME;Decreased knowledge of precautions;Pain PT Plan PT Frequency: Min 5X/week PT Treatment/Interventions: DME instruction;Gait training;Stair training;Functional mobility training;Therapeutic exercise;Patient/family education PT Recommendation Follow Up Recommendations: Home health PT Equipment Recommended: Rolling walker with 5" wheels;Wheelchair (measurements) (WC for increased mobility distances) PT Goals  Acute Rehab PT Goals PT Goal Formulation: With patient/family Time For Goal Achievement: 7 days Pt will go Supine/Side to Sit: with modified independence PT Goal: Supine/Side to Sit - Progress: Progressing toward goal Pt will go Sit to Supine/Side: with modified independence PT Goal: Sit to Supine/Side - Progress: Progressing toward goal Pt will Transfer Sit to Stand/Stand to Sit: with modified independence PT Transfer Goal: Sit to Stand/Stand to Sit - Progress: Progressing toward goal Pt will Transfer Bed to Chair/Chair to Bed: with supervision PT Transfer Goal: Bed to Chair/Chair  to Bed - Progress: Progressing toward goal Pt will Ambulate: 51 - 150 feet;with rolling walker;with supervision PT Goal: Ambulate - Progress: Progressing toward goal Pt will Go Up / Down Stairs: 1-2 stairs;with supervision;with rolling walker PT Goal: Up/Down Stairs - Progress: Other (comment) (unassessed today) Pt will Perform Home Exercise Program: Independently PT Goal: Perform Home Exercise Program - Progress: Other (comment) (unassessed today)  PT Evaluation Precautions/Restrictions  Restrictions Weight Bearing Restrictions: Yes LLE Weight Bearing: Touchdown weight bearing Prior Functioning  Home Living Lives With: Spouse;Family Receives Help From: Family Type of Home: House Home Layout: One level Home Access: Stairs to enter Entrance Stairs-Rails: None Entrance Stairs-Number of Steps: 1 (curb) Bathroom Shower/Tub: Engineer, manufacturing systems: Standard Bathroom Accessibility: Yes How Accessible: Accessible via walker Home Adaptive Equipment: None Prior Function Level of Independence: Independent with homemaking with ambulation;Independent with gait;Independent with basic ADLs;Independent with transfers Able to Take Stairs?: Yes Driving: Yes Vocation: Unemployed Cognition Cognition Arousal/Alertness: Awake/alert Overall Cognitive Status: Appears within functional limits for tasks assessed Orientation Level: Oriented X4 Sensation/Coordination Sensation Light Touch: Appears Intact Extremity Assessment RLE Assessment RLE Assessment: Within Functional Limits LLE Assessment LLE Assessment: Exceptions to WFL LLE AROM (degrees) Overall AROM Left Lower Extremity: Deficits;Other (Comment);Due to precautions;Due to pain (Bandaging) LLE Strength LLE Overall Strength: Deficits;Due to pain;Due to precautions;Other (Comment) (Bandaging) Mobility (including Balance) Bed Mobility Bed Mobility: Yes Supine to Sit: 4: Min assist;HOB flat Supine to Sit Details (indicate cue  type and reason): Assist of LLE into sitting Sitting - Scoot to Edge of Bed: 6: Modified independent (Device/Increase time) Sitting - Scoot to Edge of Bed Details (indicate cue type and reason): VC for hand placement Transfers Transfers: Yes Sit to Stand: 4: Min assist Sit to Stand Details (indicate cue type and reason): Vc for leg placement to maintain weight bearing precautions as well for hand placement for safety  to RW Stand to Sit: 4: Min assist Stand to Sit Details: VC for hand placement. Assist for LLE assist into sitting Stand Pivot Transfers: 4: Min assist Stand Pivot Transfer Details (indicate cue type and reason): Min assist for balance during hopping into recliner. VC for sequencing. Pt able to maintain weight bearing status throughout transfer (Transfer with RW) Ambulation/Gait Ambulation/Gait: No (secondary to pain and nausea)    Exercise    End of Session PT - End of Session Equipment Utilized During Treatment: Gait belt Activity Tolerance: Patient tolerated treatment well;Patient limited by pain Patient left: in chair;with call bell in reach;with family/visitor present Nurse Communication: Mobility status for transfers;Mobility status for ambulation General Behavior During Session: [facility] for tasks performed Cognition: [facility] for tasks performed  Milana Kidney 05/28/2011, 12:40 PM  05/28/2011 Milana Kidney DPT PAGER: [phone] OFFICE: [phone]

## 2011-05-28 NOTE — Progress Notes (Signed)
Subjective: 1 Day Post-Op Procedure(s) (LRB): INTRAMEDULLARY (IM) NAIL TIBIAL (Left) IRRIGATION AND DEBRIDEMENT EXTREMITY (Left) Patient reports pain as 5 on 0-10 scale.   C/o nausea.  Objective: Vital signs in last 24 hours: Temp:  [97 F (36.1 C)-101.1 F (38.4 C)] 98.4 F (36.9 C) (11/16 0610) Pulse Rate:  [59-91] 83  (11/16 0610) Resp:  [9-16] 16  (11/16 0610) BP: (91-120)/(47-68) 103/63 mmHg (11/16 0610) SpO2:  [96 %-100 %] 99 % (11/16 0610) Weight:  [64.864 kg (143 lb)] 143 lb (64.864 kg) (11/15 0951)  Intake/Output from previous day: 11/15 0701 - 11/16 0700 In: 2570 [P.O.:120; I.V.:2300] Out: 1125 [Urine:1025; Blood:100] Intake/Output this shift:     Basename 05/28/11 0635 05/27/11 1240  HGB 10.3* 12.1    Basename 05/28/11 0635 05/27/11 1240  WBC 12.2* 17.1*  RBC 3.31* 3.92  HCT 30.8* 37.0  PLT 207 234    Basename 05/28/11 0635 05/27/11 1240  NA 134* 138  K 3.9 4.3  CL 101 101  CO2 27 27  BUN 10 13  CREATININE 0.68 0.66  GLUCOSE 109* 127*  CALCIUM 8.8 9.0    Basename 05/27/11 1240  LABPT --  INR 1.07    Neurologically intact Sensation intact distally Dorsiflexion/Plantar flexion intact Incision: dressing C/D/I Compartment soft  Assessment/Plan: 1 Day Post-Op Procedure(s) (LRB): INTRAMEDULLARY (IM) NAIL TIBIAL (Left) IRRIGATION AND DEBRIDEMENT EXTREMITY (Left)  cw pt ttwb, ancef for open fracture, dc home possible sat vs. Sun. Dc foley when able to use bedpan or bedside commode.  Kaylor Simenson P 05/28/2011, 9:16 AM

## 2011-05-29 LAB — CBC
Hemoglobin: 9.5 g/dL — ABNORMAL LOW (ref 12.0–15.0)
MCH: 31.8 pg (ref 26.0–34.0)
Platelets: 188 10*3/uL (ref 150–400)
RBC: 2.99 MIL/uL — ABNORMAL LOW (ref 3.87–5.11)
WBC: 8.3 10*3/uL (ref 4.0–10.5)

## 2011-05-29 NOTE — Progress Notes (Signed)
Orthopedic Tech Progress Note Patient Details:  Christine Archer 10-Nov-1970 [id number]  Type of Splint: Short Leg Splint Location: left leg    Gaye Pollack 05/29/2011, 10:31 AM

## 2011-05-29 NOTE — Progress Notes (Signed)
Case Management:  Contacted [facility] to make aware of pt. Discharge today.  In addition, to make referral for DME [facility] wheelchair.  Tera Mater, RN, BSN Case Manager [phone]

## 2011-05-29 NOTE — Progress Notes (Signed)
Physical Therapy Treatment Patient Details Name: Christine Archer MRN: [id number] DOB: 12/30/1970 Today's Date: 05/29/2011  PT Assessment/Plan  PT - Assessment/Plan Comments on Treatment Session: Pt progressing well. She was able to ambulate an increased distance today as well as supervision with all transfers and bed mobility, with minimal assist of LLE.  PT Plan: Discharge plan remains appropriate PT Frequency: Min 5X/week Follow Up Recommendations: Home health PT Equipment Recommended: Rolling walker with 5" wheels;Wheelchair (measurements) PT Goals  Acute Rehab PT Goals PT Goal Formulation: With patient/family Time For Goal Achievement: 7 days PT Goal: Supine/Side to Sit - Progress: Progressing toward goal PT Goal: Sit to Supine/Side - Progress: Progressing toward goal PT Transfer Goal: Sit to Stand/Stand to Sit - Progress: Progressing toward goal PT Transfer Goal: Bed to Chair/Chair to Bed - Progress: Met PT Goal: Ambulate - Progress: Progressing toward goal PT Goal: Up/Down Stairs - Progress: Progressing toward goal PT Goal: Perform Home Exercise Program - Progress: Progressing toward goal  PT Treatment Precautions/Restrictions  Restrictions Weight Bearing Restrictions: Yes LLE Weight Bearing: Touchdown weight bearing Mobility (including Balance) Bed Mobility Bed Mobility: Yes Supine to Sit: 4: Min assist;HOB flat Supine to Sit Details (indicate cue type and reason): Assist of LLE. Pt able to complete all of bed mobility otherwise independently Sitting - Scoot to Edge of Bed: 6: Modified independent (Device/Increase time) Sitting - Scoot to Edge of Bed Details (indicate cue type and reason): VC for sequencing. Transfers Transfers: Yes Sit to Stand: 5: Supervision;From bed;From chair/3-in-1 Sit to Stand Details (indicate cue type and reason): VC for hand placement. Supervision for safety Stand to Sit: 5: Supervision;To chair/3-in-1 Stand to Sit Details: VC for hand  placement onto 3-1 on toilet for safety Ambulation/Gait Ambulation/Gait: Yes Ambulation/Gait Assistance: 5: Supervision Ambulation/Gait Assistance Details (indicate cue type and reason): VC for sequencing. Pt prefers to NWB LLE and hop. Short distances. Ambulation Distance (Feet): 10 Feet (20 ft second ambulation distance) Assistive device: Rolling walker Gait Pattern: Decreased step length - right;Step-to pattern Gait velocity: Decreased gait speed (Short steps) Stairs: No    Exercise    End of Session PT - End of Session Equipment Utilized During Treatment: Gait belt Activity Tolerance: Patient tolerated treatment well Patient left: in chair;with family/visitor present;with call bell in reach Nurse Communication: Mobility status for transfers;Mobility status for ambulation General Behavior During Session: [facility] for tasks performed Cognition: [facility] for tasks performed  Milana Kidney 05/29/2011, 11:18 AM  05/29/2011 Milana Kidney DPT PAGER: [phone] OFFICE: [phone]

## 2011-05-29 NOTE — Progress Notes (Signed)
Occupational Therapy Evaluation Patient Details Name: Christine Archer MRN: [id number] DOB: [birth year] Today's Date: 05/29/2011  Problem List:  Patient Active Problem List  Diagnoses  . Open fracture of left tibia and fibula    Past Medical History:  Past Medical History  Diagnosis Date  . No pertinent past medical history    Past Surgical History: No past surgical history on file.  OT Assessment/Plan/Recommendation OT Assessment Clinical Impression Statement: Pt will benefit from OT services in the acute care setting to increase I with ADLs and to increase I with functional transfers in prep for d/c home with family. OT Recommendation/Assessment: Patient will need skilled OT in the acute care venue OT Problem List: Decreased activity tolerance;Decreased knowledge of use of DME or AE;Pain;Decreased knowledge of precautions OT Therapy Diagnosis : Acute pain OT Plan OT Frequency: Min 2X/week OT Treatment/Interventions: Self-care/ADL training;DME and/or AE instruction;Therapeutic activities;Patient/family education OT Recommendation Follow Up Recommendations: None Equipment Recommended: 3 in 1 bedside comode Individuals Consulted Consulted and Agree with Results and Recommendations: Patient OT Goals Acute Rehab OT Goals OT Goal Formulation: With patient Time For Goal Achievement: 7 days ADL Goals Pt Will Perform Grooming: with modified independence;Standing at sink ADL Goal: Grooming - Progress: Other (comment) Pt Will Perform Lower Body Bathing: with modified independence;Sit to stand from bed ADL Goal: Lower Body Bathing - Progress: Other (comment) Pt Will Perform Lower Body Dressing: with modified independence;Sit to stand from bed ADL Goal: Lower Body Dressing - Progress: Other (comment) Pt Will Transfer to Toilet: with modified independence;Ambulation;with DME;3-in-1 ADL Goal: Toilet Transfer - Progress: Other (comment) Pt Will Perform Toileting - Clothing Manipulation:  with modified independence;Standing ADL Goal: Toileting - Clothing Manipulation - Progress: Other (comment)  OT Evaluation Precautions/Restrictions  Restrictions Weight Bearing Restrictions: Yes LLE Weight Bearing: Touchdown weight bearing Prior Functioning Home Living Lives With: Spouse;Family Receives Help From: Family Type of Home: House Home Layout: One level Home Access: Stairs to enter Entrance Stairs-Rails: None Entrance Stairs-Number of Steps: 1 Bathroom Shower/Tub: Associate Professor: Yes How Accessible: Accessible via walker Home Adaptive Equipment: None Prior Function Level of Independence: Independent with homemaking with ambulation;Independent with gait;Independent with basic ADLs;Independent with transfers Able to Take Stairs?: Yes Driving: Yes Vocation: Unemployed ADL ADL Eating/Feeding: Simulated;Independent Where Assessed - Eating/Feeding: Chair Grooming: Simulated;Supervision/safety Where Assessed - Grooming: Standing at sink Upper Body Bathing: Simulated;Independent Where Assessed - Upper Body Bathing: Sitting, bed Lower Body Bathing: Simulated;Minimal assistance Where Assessed - Lower Body Bathing: Sit to stand from bed Upper Body Dressing: Simulated;Independent Where Assessed - Upper Body Dressing: Sitting, bed Lower Body Dressing: Simulated;Minimal assistance Where Assessed - Lower Body Dressing: Sit to stand from bed Toilet Transfer: Performed;Supervision/safety Toilet Transfer Method: Proofreader: Raised toilet seat with arms (or 3-in-1 over toilet) Toileting - Clothing Manipulation: Performed;Supervision/safety Where Assessed - Glass blower/designer Manipulation: Standing Toileting - Hygiene: Performed;Supervision/safety Where Assessed - Toileting Hygiene: Sit on 3-in-1 or toilet Tub/Shower Transfer: Not assessed Tub/Shower Transfer Method: Not assessed Equipment Used:  Rolling walker Vision/Perception    Cognition Cognition Arousal/Alertness: Awake/alert Overall Cognitive Status: Appears within functional limits for tasks assessed Orientation Level: Oriented X4 Sensation/Coordination   Extremity Assessment RUE Assessment RUE Assessment: Within Functional Limits LUE Assessment LUE Assessment: Within Functional Limits Mobility  Bed Mobility Bed Mobility: Yes Supine to Sit: 4: Min assist;HOB flat Supine to Sit Details (indicate cue type and reason): assist of L LE. Pt able to complete all of bed mobility otherwise indpendently. Sitting -  Scoot to [place] of Bed: 6: Modified independent (Device/Increase time) Sitting - Scoot to Edge of Bed Details (indicate cue type and reason): VC for sequencing. Transfers Transfers: Yes Sit to Stand: 5: Supervision;From bed;From chair/3-in-1 Sit to Stand Details (indicate cue type and reason): VC for hand placement. Supervision for safety. Stand to Sit: 5: Supervision;To chair/3-in-1 Stand to Sit Details: VC for hand placement onto 3-1 on toilet for safety. Exercises   End of Session OT - End of Session Equipment Utilized During Treatment: Gait belt Activity Tolerance: Patient tolerated treatment well Patient left: in chair;with call bell in reach;with family/visitor present General Behavior During Session: [facility] for tasks performed Cognition: [facility] for tasks performed   Cipriano Mile 05/29/2011, 1:06 PM  05/29/2011 Cipriano Mile OTR/L Pager [phone] Office [phone]
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
## 2011-06-01 ENCOUNTER — Encounter (HOSPITAL_COMMUNITY): Payer: Self-pay | Admitting: Orthopedic Surgery

## 2012-07-11 ENCOUNTER — Encounter (HOSPITAL_BASED_OUTPATIENT_CLINIC_OR_DEPARTMENT_OTHER): Payer: Self-pay | Admitting: *Deleted

## 2012-07-11 ENCOUNTER — Emergency Department (HOSPITAL_BASED_OUTPATIENT_CLINIC_OR_DEPARTMENT_OTHER)
Admission: EM | Admit: 2012-07-11 | Discharge: 2012-07-12 | Disposition: A | Discharge status: Home / Self Care | Payer: 59 | Attending: Emergency Medicine | Admitting: Emergency Medicine

## 2012-07-11 ENCOUNTER — Emergency Department (HOSPITAL_BASED_OUTPATIENT_CLINIC_OR_DEPARTMENT_OTHER): Payer: 59

## 2012-07-11 DIAGNOSIS — Z7901 Long term (current) use of anticoagulants: Secondary | ICD-10-CM | POA: Insufficient documentation

## 2012-07-11 DIAGNOSIS — R0789 Other chest pain: Secondary | ICD-10-CM

## 2012-07-11 DIAGNOSIS — R071 Chest pain on breathing: Secondary | ICD-10-CM | POA: Insufficient documentation

## 2012-07-11 LAB — D-DIMER, QUANTITATIVE: D-Dimer, Quant: <0.27 ug/mL-FEU (ref 0.00–0.48)

## 2012-07-11 MED ORDER — IBUPROFEN 800 MG PO TABS: 800.0000 mg | ORAL_TABLET | Freq: Three times a day (TID) | ORAL | Status: DC

## 2012-07-11 MED ORDER — HYDROCODONE-ACETAMINOPHEN 5-325 MG PO TABS: 2.0000 | ORAL_TABLET | ORAL | Status: DC | PRN

## 2012-07-11 NOTE — ED Notes (Signed)
Pt with mid to upper back pain states that she exercised earlier today and that while in shower she twisted and began to have pain. Pt states that pain is worse with deep inspiration

## 2012-07-11 NOTE — ED Provider Notes (Signed)
History     CSN: [id number]  Arrival date & time 07/11/12  1753   First MD Initiated Contact with Patient 07/11/12 2029      Chief Complaint  Patient presents with  . Back Pain    (Consider location/radiation/quality/duration/timing/severity/associated sxs/prior treatment) Patient is a 41 y.o. female presenting with back pain. The history is provided by the patient. No language interpreter was used.  Back Pain  This is a new problem. The problem occurs constantly. The problem has been gradually worsening. The pain is associated with no known injury. The pain is present in the thoracic spine. The quality of the pain is described as stabbing. The pain does not radiate. The pain is at a severity of 10/10. The pain is severe. The symptoms are aggravated by twisting. The pain is the same all the time. Pertinent negatives include no fever. She has tried nothing for the symptoms. The treatment provided no relief.    Past Medical History  Diagnosis Date  . No pertinent past medical history     Past Surgical History  Procedure Date  . Tibia im nail insertion 05/27/2011    Procedure: INTRAMEDULLARY (IM) NAIL TIBIAL;  Surgeon: Eulas Post;  Location: [facility] OR;  Service: Orthopedics;  Laterality: Left;  . I&d extremity 05/27/2011    Procedure: IRRIGATION AND DEBRIDEMENT EXTREMITY;  Surgeon: Eulas Post;  Location: [facility] OR;  Service: Orthopedics;  Laterality: Left;  . Orthopedic surgery     History reviewed. No pertinent family history.  History  Substance Use Topics  . Smoking status: Never Smoker   . Smokeless tobacco: Not on file  . Alcohol Use: No    OB History    Grav Para Term Preterm Abortions TAB SAB Ect Mult Living                  Review of Systems  Constitutional: Negative for fever.  Musculoskeletal: Positive for back pain.  All other systems reviewed and are negative.    Allergies  Review of patient's allergies indicates no known allergies.  Home  Medications   Current Outpatient Rx  Name  Route  Sig  Dispense  Refill  . ENOXAPARIN SODIUM 30 MG/0.3ML [place] SOLN   Subcutaneous   Inject 0.4 mLs (40 mg total) into the skin daily.   21 Syringe   0     BP 104/64  Pulse 80  Temp 98.2 F (36.8 C) (Oral)  Resp 18  SpO2 100%  LMP 06/10/2012  Physical Exam  Nursing note and vitals reviewed. Constitutional: She is oriented to person, place, and time. She appears well-developed and well-nourished.  HENT:  Head: Normocephalic and atraumatic.  Right Ear: External ear normal.  Left Ear: External ear normal.  Mouth/Throat: Oropharynx is clear and moist.  Eyes: Conjunctivae normal and EOM are normal. Pupils are equal, round, and reactive to light.  Neck: Normal range of motion. Neck supple.  Cardiovascular: Normal rate and normal heart sounds.   Pulmonary/Chest: Effort normal and breath sounds normal.  Abdominal: Soft.  Musculoskeletal:       Pain with lifting left arm.  No pain to palpation  Neurological: She is alert and oriented to person, place, and time. She has normal reflexes.  Skin: Skin is warm.  Psychiatric: She has a normal mood and affect.    ED Course  Procedures (including critical care time)  Labs Reviewed - No data to display Dg Chest 2 View  07/11/2012  *RADIOLOGY REPORT*  Clinical Data:  Left-sided chest pain, radiating to the back.  CHEST - 2 VIEW  Comparison: None.  Findings: The lungs are well-aerated and clear.  There is no evidence of focal opacification, pleural effusion or pneumothorax.  The heart is normal in size; the mediastinal contour is within normal limits.  No acute osseous abnormalities are seen.  IMPRESSION: No acute cardiopulmonary process seen; no displaced rib fractures identified.   Original Report Authenticated By: Tonia Ghent, M.D.      No diagnosis found.    MDM   Date: 07/11/2012  Rate: 66  Rhythm: normal sinus rhythm and sinus arrhythmia  QRS Axis: normal  Intervals: normal   ST/T Wave abnormalities: normal  Conduction Disutrbances:none  Narrative Interpretation:   Old EKG Reviewed: none available   ddimer normal      Elson Areas, [place] 07/11/12 2153  Lonia Skinner [place], [place] 07/11/12 2255

## 2012-07-11 NOTE — ED Notes (Signed)
Patient stated to me that she is having chest pain, I explained protacall for chest pain and tried to help her with gown. Patient stayed on her phone conversation at length as I waited outside door with ECG machine. I knocked on door, patient still not in gown and still on phone.

## 2012-07-11 NOTE — ED Notes (Signed)
PA at bedside giving test results and plan of care .

## 2012-07-11 NOTE — ED Provider Notes (Signed)
Medical screening examination/treatment/procedure(s) were performed by non-physician practitioner and as supervising physician I was immediately available for consultation/collaboration.   Jervon Ream, MD 07/11/12 2331 

## 2017-06-06 ENCOUNTER — Other Ambulatory Visit: Payer: Self-pay

## 2017-06-06 ENCOUNTER — Encounter (HOSPITAL_BASED_OUTPATIENT_CLINIC_OR_DEPARTMENT_OTHER): Payer: Self-pay

## 2017-06-06 ENCOUNTER — Emergency Department (HOSPITAL_BASED_OUTPATIENT_CLINIC_OR_DEPARTMENT_OTHER)
Admission: EM | Admit: 2017-06-06 | Discharge: 2017-06-07 | Disposition: A | Discharge status: Home / Self Care | Payer: 59 | Attending: Emergency Medicine | Admitting: Emergency Medicine

## 2017-06-06 DIAGNOSIS — K0889 Other specified disorders of teeth and supporting structures: Secondary | ICD-10-CM | POA: Diagnosis present

## 2017-06-06 MED ORDER — BUPIVACAINE-EPINEPHRINE (PF) 0.5% -1:200000 IJ SOLN
1.8000 mL | Freq: Once | INTRAMUSCULAR | Status: AC
  Administered 2017-06-06: 1.8 mL

## 2017-06-06 MED ORDER — BUPIVACAINE-EPINEPHRINE (PF) 0.5% -1:200000 IJ SOLN
INTRAMUSCULAR | Status: AC
  Filled 2017-06-06: qty 1.8

## 2017-06-06 NOTE — ED Notes (Signed)
ED Provider at bedside. 

## 2017-06-06 NOTE — ED Provider Notes (Signed)
[facility] Provider Note: Christine DellJ. Lane Tonika Eden, MD, FACEP  CSN: [id number] MRN: [id number] ARRIVAL: 06/06/17 at 2304 ROOM: [facility]04/[facility]04   CHIEF COMPLAINT  Dental Pain   HISTORY OF PRESENT ILLNESS  06/06/17 11:47 PM Christine Archer is a 46 y.o. female with a history of intermittent pain associated with a severely carious right upper second molar.  She is here with pain in that tooth that began this morning.  She rates her pain as an 8 out of 10, worse with eating.  She is having to chew on her left side to minimize the pain.  She needs to have definitive treatment but is putting it off due to using up her dental insurance for the year.  She has been taking ibuprofen without adequate relief.  [place] reveals the patient has received no opioid prescriptions in the past year..   Past Medical History:  Diagnosis Date  . No pertinent past medical history     Past Surgical History:  Procedure Laterality Date  . I&D EXTREMITY  05/27/2011   Procedure: IRRIGATION AND DEBRIDEMENT EXTREMITY;  Surgeon: Eulas PostJoshua P Landau;  Location: [facility] OR;  Service: Orthopedics;  Laterality: Left;  . ORTHOPEDIC SURGERY    . TIBIA IM NAIL INSERTION  05/27/2011   Procedure: INTRAMEDULLARY (IM) NAIL TIBIAL;  Surgeon: Eulas PostJoshua P Landau;  Location: [facility] OR;  Service: Orthopedics;  Laterality: Left;    No family history on file.  Social History   Tobacco Use  . Smoking status: Never Smoker  Substance Use Topics  . Alcohol use: No  . Drug use: No    Prior to Admission medications   Not on File    Allergies Patient has no known allergies.   REVIEW OF SYSTEMS  Negative except as noted here or in the History of Present Illness.   PHYSICAL EXAMINATION  Initial Vital Signs Blood pressure 110/62, pulse 65, temperature 98.1 F (36.7 C), temperature source Oral, resp. rate 16, weight 77.4 kg (170 lb 10.2 oz), SpO2 99  %.  Examination General: Well-developed, well-nourished female in no acute distress; appearance consistent with age of record HENT: normocephalic; atraumatic; right upper second molar carious to the gumline with erythema and tenderness of the gum Eyes: pupils equal, round and reactive to light; extraocular muscles intact Neck: supple; no lymphadenopathy Heart: regular rate and rhythm Lungs: clear to auscultation bilaterally Abdomen: soft; nondistended; nontender; bowel sounds present Extremities: No deformity; full range of motion Neurologic: Awake, alert and oriented; motor function intact in all extremities and symmetric; no facial droop Skin: Warm and dry Psychiatric: Normal mood and affect   RESULTS  Summary of this visit's results, reviewed by myself:   EKG Interpretation  Date/Time:    Ventricular Rate:    PR Interval:    QRS Duration:   QT Interval:    QTC Calculation:   R Axis:     Text Interpretation:        Laboratory Studies: No results found for this or any previous visit (from the past 24 hour(s)). Imaging Studies: No results found.  ED COURSE  Nursing notes and initial vitals signs, including pulse oximetry, reviewed.  Vitals:   06/06/17 2311  BP: 110/62  Pulse: 65  Resp: 16  Temp: 98.1 F (36.7 C)  TempSrc: Oral  SpO2: 99%  Weight: 77.4 kg (170 lb 10.2 oz)   She intends to follow-up with her dentist at the beginning of the year.  She was  advised to follow-up sooner if symptoms worsen.  PROCEDURES   DENTAL BLOCK 1.8 milliliters of 0.5% bupivacaine with epinephrine were injected into the buccal fold adjacent to the right upper second molar. The patient tolerated this well and there were no immediate complications. Adequate analgesia was obtained.   ED DIAGNOSES     ICD-10-CM   1. Pain, dental K08.89        Paula LibraMolpus, Onofre Gains, MD 06/06/17 2359

## 2017-06-06 NOTE — ED Triage Notes (Signed)
Pt c/o right upper dental pain that started tonight.  It's a tooth that she is supposed to have a root canal on, but she has had other dental work this year and her insurance has run out.  Pt took 600mg  ibuprofen at 2100

## 2017-06-06 NOTE — ED Notes (Signed)
Family at bedside. 

## 2017-06-07 MED ORDER — HYDROCODONE-ACETAMINOPHEN 5-325 MG PO TABS: 1.0000 | ORAL_TABLET | Freq: Four times a day (QID) | ORAL | 0 refills | Status: DC | PRN

## 2017-06-07 MED ORDER — PENICILLIN V POTASSIUM 250 MG PO TABS
500.0000 mg | ORAL_TABLET | Freq: Once | ORAL | Status: AC
  Administered 2017-06-07: 500 mg via ORAL

## 2017-06-07 MED ORDER — PENICILLIN V POTASSIUM 250 MG PO TABS
ORAL_TABLET | ORAL | Status: AC
  Filled 2017-06-07: qty 2

## 2017-06-07 MED ORDER — PENICILLIN V POTASSIUM 500 MG PO TABS: 500.0000 mg | ORAL_TABLET | Freq: Four times a day (QID) | ORAL | 0 refills | Status: DC

## 2018-04-10 ENCOUNTER — Encounter (HOSPITAL_BASED_OUTPATIENT_CLINIC_OR_DEPARTMENT_OTHER): Payer: Self-pay

## 2018-04-10 ENCOUNTER — Emergency Department (HOSPITAL_BASED_OUTPATIENT_CLINIC_OR_DEPARTMENT_OTHER)
Admission: EM | Admit: 2018-04-10 | Discharge: 2018-04-10 | Disposition: A | Discharge status: Home / Self Care | Payer: 59 | Attending: Emergency Medicine | Admitting: Emergency Medicine

## 2018-04-10 ENCOUNTER — Emergency Department (HOSPITAL_BASED_OUTPATIENT_CLINIC_OR_DEPARTMENT_OTHER): Payer: 59

## 2018-04-10 DIAGNOSIS — R197 Diarrhea, unspecified: Secondary | ICD-10-CM | POA: Diagnosis not present

## 2018-04-10 DIAGNOSIS — R1013 Epigastric pain: Secondary | ICD-10-CM | POA: Diagnosis present

## 2018-04-10 LAB — CBC WITH DIFFERENTIAL/PLATELET
Basophils Absolute: 0 10*3/uL (ref 0.0–0.1)
Basophils Relative: 0 %
EOS ABS: 0 10*3/uL (ref 0.0–0.7)
Eosinophils Relative: 0 %
HCT: 40.8 % (ref 36.0–46.0)
Hemoglobin: 13.5 g/dL (ref 12.0–15.0)
Lymphocytes Relative: 29 %
Lymphs Abs: 1.5 10*3/uL (ref 0.7–4.0)
MCH: 31.3 pg (ref 26.0–34.0)
MCHC: 33.1 g/dL (ref 30.0–36.0)
MCV: 94.7 fL (ref 78.0–100.0)
MONO ABS: 0.5 10*3/uL (ref 0.1–1.0)
Monocytes Relative: 10 %
Neutro Abs: 3.1 10*3/uL (ref 1.7–7.7)
Neutrophils Relative %: 61 %
Platelets: 287 10*3/uL (ref 150–400)
RBC: 4.31 MIL/uL (ref 3.87–5.11)
RDW: 12.6 % (ref 11.5–15.5)
WBC: 5.1 10*3/uL (ref 4.0–10.5)

## 2018-04-10 LAB — COMPREHENSIVE METABOLIC PANEL
ALT: 13 U/L (ref 0–44)
ANION GAP: 11 (ref 5–15)
AST: 20 U/L (ref 15–41)
Albumin: 4.5 g/dL (ref 3.5–5.0)
Alkaline Phosphatase: 60 U/L (ref 38–126)
BILIRUBIN TOTAL: 0.8 mg/dL (ref 0.3–1.2)
BUN: 11 mg/dL (ref 6–20)
CHLORIDE: 102 mmol/L (ref 98–111)
CO2: 27 mmol/L (ref 22–32)
Calcium: 9.4 mg/dL (ref 8.9–10.3)
Creatinine, Ser: 0.82 mg/dL (ref 0.44–1.00)
Glucose, Bld: 93 mg/dL (ref 70–99)
POTASSIUM: 3.9 mmol/L (ref 3.5–5.1)
SODIUM: 140 mmol/L (ref 135–145)
TOTAL PROTEIN: 8.3 g/dL — AB (ref 6.5–8.1)

## 2018-04-10 LAB — LIPASE, BLOOD: LIPASE: 35 U/L (ref 11–51)

## 2018-04-10 LAB — URINALYSIS, ROUTINE W REFLEX MICROSCOPIC
Bilirubin Urine: NEGATIVE
GLUCOSE, UA: NEGATIVE mg/dL
Hgb urine dipstick: NEGATIVE
KETONES UR: NEGATIVE mg/dL
Leukocytes, UA: NEGATIVE
NITRITE: NEGATIVE
PROTEIN: NEGATIVE mg/dL
Specific Gravity, Urine: 1.015 (ref 1.005–1.030)
pH: 8 (ref 5.0–8.0)

## 2018-04-10 LAB — PREGNANCY, URINE: PREG TEST UR: NEGATIVE

## 2018-04-10 MED ORDER — SUCRALFATE 1 GM/10ML PO SUSP: 1.0000 g | Freq: Three times a day (TID) | ORAL | 0 refills | Status: DC

## 2018-04-10 MED ORDER — SODIUM CHLORIDE 0.9 % IV SOLN
INTRAVENOUS | Status: DC | PRN
  Administered 2018-04-10: 250 mL via INTRAVENOUS

## 2018-04-10 MED ORDER — SUCRALFATE 1 G PO TABS
1.0000 g | ORAL_TABLET | Freq: Once | ORAL | Status: AC
  Administered 2018-04-10: 1 g via ORAL
  Filled 2018-04-10: qty 1

## 2018-04-10 MED ORDER — ONDANSETRON HCL 4 MG/2ML IJ SOLN
4.0000 mg | Freq: Once | INTRAMUSCULAR | Status: AC
  Administered 2018-04-10: 4 mg via INTRAVENOUS
  Filled 2018-04-10: qty 2

## 2018-04-10 MED ORDER — PANTOPRAZOLE SODIUM 20 MG PO TBEC: 20.0000 mg | DELAYED_RELEASE_TABLET | Freq: Every day | ORAL | 0 refills | Status: DC

## 2018-04-10 MED ORDER — ONDANSETRON 4 MG PO TBDP: 4.0000 mg | ORAL_TABLET | Freq: Three times a day (TID) | ORAL | 0 refills | Status: AC | PRN

## 2018-04-10 MED ORDER — GI COCKTAIL ~~LOC~~
30.0000 mL | Freq: Once | ORAL | Status: AC
Start: 2018-04-10 — End: 2018-04-10
  Administered 2018-04-10: 30 mL via ORAL
  Filled 2018-04-10: qty 30

## 2018-04-10 MED ORDER — FAMOTIDINE IN NACL 20-0.9 MG/50ML-% IV SOLN
20.0000 mg | Freq: Once | INTRAVENOUS | Status: AC
  Administered 2018-04-10: 20 mg via INTRAVENOUS
  Filled 2018-04-10: qty 50

## 2018-04-10 MED ORDER — SODIUM CHLORIDE 0.9 % IV BOLUS
1000.0000 mL | Freq: Once | INTRAVENOUS | Status: AC
  Administered 2018-04-10: 1000 mL via INTRAVENOUS

## 2018-04-10 MED ORDER — IOPAMIDOL (ISOVUE-300) INJECTION 61%
100.0000 mL | Freq: Once | INTRAVENOUS | Status: AC | PRN
  Administered 2018-04-10: 100 mL via INTRAVENOUS

## 2018-04-10 MED FILL — ONDANSETRON ODT 4 MG TABLET: 4 | 2 days supply | Qty: 8 | Fill #0

## 2018-04-10 MED FILL — CARAFATE 1 GM/10 ML SUSP: 1 | 11 days supply | Qty: 420 | Fill #0

## 2018-04-10 MED FILL — PANTOPRAZOLE SOD DR 20 MG T: 20 | 14 days supply | Qty: 14 | Fill #0

## 2018-04-10 NOTE — Discharge Instructions (Addendum)
Your abdominal pain is likely from gastritis, reflux or a stomach ulcer. You will need to take the prescribed proton pump inhibitor as directed, and avoid spicy/fatty/acidic foods. Avoid laying down flat within 30 minutes of eating. Avoid NSAIDs like ibuprofen or Aleve on an empty stomach. Use zofran as needed for nausea. Follow up with the gastroenterologist (GI doctor) listed for ongoing evaluation of your abdominal pain. Return to the ER for new or worsening symptoms, any additional concers.  Have discussed your other CT findings.  Please follow-up with your GI doctor as discussed.  SEEK IMMEDIATE MEDICAL ATTENTION IF YOU DEVELOP ANY OF THE FOLLOWING SYMPTOMS: The pain does not go away or becomes severe.  A temperature above 101 develops.  Repeated vomiting occurs (multiple episodes).  Blood is being passed in stools or vomit (bright red or black tarry stools).  Return also if you develop chest pain, difficulty breathing, dizziness or fainting

## 2018-04-10 NOTE — ED Triage Notes (Signed)
Pt c/o abdominal pain with diarrhea since last tuesday

## 2018-04-10 NOTE — ED Provider Notes (Signed)
[facility] EMERGENCY DEPARTMENT Provider Note   CSN: [id number] Arrival date & time: 04/10/18  4098     History   Chief Complaint Chief Complaint  Patient presents with  . Abdominal Pain    HPI Christine Archer is a 47 y.o. female.  HPI 47 year old African-American female with no pertinent past medical history presents to the emergency department today for evaluation of abdominal pain and diarrhea.  Patient states that symptoms started approximately 1 week ago.  She reports at that time she was having some discomfort in her stomach.  She reports that "she had increased and gurgling".  Patient states that the symptoms have persisted.  She reports some nausea denies any emesis.  The pain is worse with food.  Patient denies any urinary symptoms.  She does report having some loose stools this morning but denies any melena or hematochezia.  Denies any fevers or chills.  She reports recent travel to [facility] but no other country travel.  She denies any chronic NSAID use.  States she is active and fit.  Patient has not taken anything except for Pepto-Bismol for her symptoms with little relief.  Ho history of any abdominal surgeries.  States that the symptoms are intermittent.  She reports some generalized fatigue.  No history of same pain.  Pt denies any fever, chill, ha, vision changes, lightheadedness,  congestion, neck pain, cp, sob, cough, urinary symptoms, change in bowel habits, melena, hematochezia, lower extremity paresthesias.  Past Medical History:  Diagnosis Date  . No pertinent past medical history     Patient Active Problem List   Diagnosis Date Noted  . Open fracture of left tibia and fibula 05/27/2011    Past Surgical History:  Procedure Laterality Date  . I&D EXTREMITY  05/27/2011   Procedure: IRRIGATION AND DEBRIDEMENT EXTREMITY;  Surgeon: Eulas Post;  Location: [facility] OR;  Service: Orthopedics;  Laterality: Left;  . ORTHOPEDIC SURGERY    . TIBIA IM NAIL  INSERTION  05/27/2011   Procedure: INTRAMEDULLARY (IM) NAIL TIBIAL;  Surgeon: Eulas Post;  Location: [facility] OR;  Service: Orthopedics;  Laterality: Left;     OB History   None      Home Medications    Prior to Admission medications   Medication Sig Start Date End Date Taking? Authorizing Provider  HYDROcodone-acetaminophen (NORCO) 5-325 MG tablet Take 1 tablet by mouth every 6 (six) hours as needed (for pain). 06/07/17   Molpus, John, MD  penicillin v potassium (VEETID) 500 MG tablet Take 1 tablet (500 mg total) by mouth 4 (four) times daily. 06/07/17   Molpus, John, MD    Family History No family history on file.  Social History Social History   Tobacco Use  . Smoking status: Never Smoker  . Smokeless tobacco: Never Used  Substance Use Topics  . Alcohol use: No  . Drug use: No     Allergies   Patient has no known allergies.   Review of Systems Review of Systems  All other systems reviewed and are negative.    Physical Exam Updated Vital Signs BP 122/69 (BP Location: Right Arm)   Pulse 66   Temp 98.3 F (36.8 C) (Oral)   Ht 5\' 5"  (1.651 m)   Wt 72.6 kg   LMP 12/08/2017   SpO2 100%   BMI 26.63 kg/m   Physical Exam  Constitutional: She is oriented to person, place, and time. She appears well-developed and well-nourished.  Non-toxic appearance. No distress.  HENT:  Head: Normocephalic and atraumatic.  Nose: Nose normal.  Mouth/Throat: Oropharynx is clear and moist.  Eyes: Pupils are equal, round, and reactive to light. Conjunctivae are normal. Right eye exhibits no discharge. Left eye exhibits no discharge.  Neck: Normal range of motion. Neck supple.  Cardiovascular: Normal rate, regular rhythm, normal heart sounds and intact distal pulses.  Pulmonary/Chest: Effort normal and breath sounds normal. No respiratory distress. She exhibits no tenderness.  Abdominal: Soft. Normal appearance and bowel sounds are normal. There is no tenderness. There is no  rigidity, no rebound, no guarding, no CVA tenderness, no tenderness at McBurney's point and negative Murphy's sign.  Musculoskeletal: Normal range of motion. She exhibits no tenderness.  Lymphadenopathy:    She has no cervical adenopathy.  Neurological: She is alert and oriented to person, place, and time.  Skin: Skin is warm and dry. Capillary refill takes less than 2 seconds.  Psychiatric: Her behavior is normal. Judgment and thought content normal.  Nursing note and vitals reviewed.    ED Treatments / Results  Labs (all labs ordered are listed, but only abnormal results are displayed) Labs Reviewed  COMPREHENSIVE METABOLIC PANEL - Abnormal; Notable for the following components:      Result Value   Total Protein 8.3 (*)    All other components within normal limits  CBC WITH DIFFERENTIAL/PLATELET  LIPASE, BLOOD  URINALYSIS, ROUTINE W REFLEX MICROSCOPIC  PREGNANCY, URINE    EKG None  Radiology Ct Abdomen Pelvis W Contrast  Result Date: 04/10/2018 CLINICAL DATA:  Generalized abdominal pain with diarrhea EXAM: CT ABDOMEN AND PELVIS WITH CONTRAST TECHNIQUE: Multidetector CT imaging of the abdomen and pelvis was performed using the standard protocol following bolus administration of intravenous contrast. CONTRAST:  ISOVUE-300 IOPAMIDOL (ISOVUE-300) INJECTION 61% COMPARISON:  None. FINDINGS: Lower chest: Lung lung bases are clear. Hepatobiliary: There is underlying hepatic steatosis. There are cysts scattered throughout the liver. The largest individual cyst is in the mid right lobe region measuring 2.0 x 2.0 cm. There is a mass arising in the left lobe of the liver measuring 1.6 x 1.1 cm which cannot be classified as a cyst. It appears unchanged on delayed images compared to venous phase images. Gallbladder wall is not appreciably thickened. No biliary duct dilatation. Pancreas: There is no evident pancreatic mass or inflammatory focus. Spleen: No splenic lesions are evident.  Adrenals/Urinary Tract: Adrenals bilaterally appear unremarkable. There is a 7 x 7 mm cyst in the lateral lower pole left kidney. There is no evident hydronephrosis on either side. There is no renal or ureteral calculus apparent on either side. Urinary bladder is midline with wall thickness within normal limits for degree of distention. Stomach/Bowel: There is no appreciable bowel wall or mesenteric thickening. There is no evident bowel obstruction. There is no appreciable free air or portal venous air. Vascular/Lymphatic: There is no abdominal aortic aneurysm. No vascular lesions are evident. There is no evident adenopathy in the abdomen or pelvis. Reproductive: Uterus is retroverted. No pelvic mass evident. There is a small amount of free fluid in the rightward aspect of the cul-de-sac. Other: Appendix not well seen. No periappendiceal region inflammation evident. No abscess evident in the abdomen pelvis. There is no ascites beyond the slight fluid seen in the cul-de-sac region. Musculoskeletal: No blastic or lytic bone lesions are appreciable. There is no intramuscular or abdominal wall lesion. IMPRESSION: 1. No evident bowel wall thickening or bowel obstruction. No abscess evident in the abdomen or pelvis. No periappendiceal region inflammatory change.  2.  No renal or ureteral calculi.  No hydronephrosis. 3. Small amount of free fluid in the cul-de-sac which is upper physiologic in amount. Potentially this finding could indicate small recent ovarian cyst rupture. 4. Hepatic steatosis with multiple liver cysts. There is a decreased attenuation mass in the left lobe which cannot be classified as a cyst which appears benign and does not show differential appearance between venous phase and delayed phase imaging. Electronically Signed   By: Bretta Bang III M.D.   On: 04/10/2018 12:02    Procedures Procedures (including critical care time)  Medications Ordered in ED Medications  gi cocktail  (Maalox,Lidocaine,Donnatal) (30 mLs Oral Given 04/10/18 1114)  famotidine (PEPCID) IVPB 20 mg premix (0 mg Intravenous Stopped 04/10/18 1233)  ondansetron (ZOFRAN) injection 4 mg (4 mg Intravenous Given 04/10/18 1120)  sodium chloride 0.9 % bolus 1,000 mL ( Intravenous Stopped 04/10/18 1242)  sucralfate (CARAFATE) tablet 1 g (1 g Oral Given 04/10/18 1114)  iopamidol (ISOVUE-300) 61 % injection 100 mL (100 mLs Intravenous Contrast Given 04/10/18 1147)     Initial Impression / Assessment and Plan / ED Course  I have reviewed the triage vital signs and the nursing notes.  Pertinent labs & imaging results that were available during my care of the patient were reviewed by me and considered in my medical decision making (see chart for details).     Patient is nontoxic, nonseptic appearing, in no apparent distress.  Patient's pain and other symptoms adequately managed in emergency department.  Fluid bolus given.  Labs, imaging and vitals reviewed.  Patient does not meet the SIRS or Sepsis criteria.  On repeat exam patient does not have a surgical abdomin and there are no peritoneal signs.  No indication of appendicitis, bowel obstruction, bowel perforation, cholecystitis, diverticulitis, PID or ectopic pregnancy. Presentation concerning for pud vs gastritis. Pt feels improved after gi cocktail. Tolerating po fluids. CT scans shows no acute finding to explain pain. Pt does have several cyst and a liver mass that was discussed with pt. Pt does have some pelvic fluid most likely for ruptured cyst hpwoever doubt this is causing pt epigastric pain. She denies pelvic pain or discharge doubt pid. Patient discharged home with symptomatic treatment and given strict instructions for follow-up with their primary care physician and gi.  I have also discussed reasons to return immediately to the ER.  Patient expresses understanding and agrees with plan.  Pt is hemodynamically stable, in NAD, & able to ambulate in the ED.  Evaluation does not show pathology that would require ongoing emergent intervention or inpatient treatment. I explained the diagnosis to the patient. Pain has been managed & has no complaints prior to dc. Pt is comfortable with above plan and is stable for discharge at this time. All questions were answered prior to disposition. Strict return precautions for f/u to the ED were discussed. Encouraged follow up with PCP.  PT dicussed with my attending Dr. Pilar Plate who is agreeable with the above plan.     Final Clinical Impressions(s) / ED Diagnoses   Final diagnoses:  Epigastric pain    ED Discharge Orders         Ordered    ondansetron (ZOFRAN ODT) 4 MG disintegrating tablet  Every 8 hours PRN     04/10/18 1242    pantoprazole (PROTONIX) 20 MG tablet  Daily     04/10/18 1242    sucralfate (CARAFATE) 1 GM/10ML suspension  3 times daily with meals & bedtime  04/10/18 1242           Rise Mu, PA-C 04/10/18 2213    Sabas Sous, MD 04/11/18 [phone]

## 2018-04-12 ENCOUNTER — Encounter: Payer: Self-pay | Admitting: Gastroenterology

## 2018-04-12 ENCOUNTER — Other Ambulatory Visit (INDEPENDENT_AMBULATORY_CARE_PROVIDER_SITE_OTHER): Payer: 59

## 2018-04-12 ENCOUNTER — Other Ambulatory Visit: Payer: Self-pay | Admitting: Gastroenterology

## 2018-04-12 ENCOUNTER — Ambulatory Visit: Payer: 59 | Admitting: Gastroenterology

## 2018-04-12 VITALS — BP 116/70 | HR 72 | Ht 65.0 in | Wt 163.0 lb

## 2018-04-12 DIAGNOSIS — R160 Hepatomegaly, not elsewhere classified: Secondary | ICD-10-CM

## 2018-04-12 DIAGNOSIS — Z1211 Encounter for screening for malignant neoplasm of colon: Secondary | ICD-10-CM | POA: Diagnosis not present

## 2018-04-12 DIAGNOSIS — K760 Fatty (change of) liver, not elsewhere classified: Secondary | ICD-10-CM

## 2018-04-12 DIAGNOSIS — R109 Unspecified abdominal pain: Secondary | ICD-10-CM

## 2018-04-12 DIAGNOSIS — Z1212 Encounter for screening for malignant neoplasm of rectum: Secondary | ICD-10-CM

## 2018-04-12 DIAGNOSIS — K7689 Other specified diseases of liver: Secondary | ICD-10-CM | POA: Diagnosis not present

## 2018-04-12 LAB — H. PYLORI ANTIBODY, IGG: H PYLORI IGG: NEGATIVE

## 2018-04-12 MED ORDER — NA SULFATE-K SULFATE-MG SULF 17.5-3.13-1.6 GM/177ML PO SOLN: 1.0000 | Freq: Once | ORAL | 0 refills | Status: AC

## 2018-04-12 NOTE — Progress Notes (Signed)
Chief Complaint: Abdominal pain   Referring Provider:     Sabas Sous, MD (emergency department)    HPI:     Christine Archer is a 47 y.o. female referred to the Gastroenterology Clinic for evaluation of abdominal pain.  She states that she had acute onset generalized abdominal pain in the upper and lower abdomen starting 9 days ago.  She described gas-like pains which typically started post p.o. intake.  Symptoms usually improved with flatus, belch, or BM but then would later recur.  Had nausea for only one day without recurrence and no emesis.  Trialed Pepto-Bismol with some short-term relief as well as OTC antacid with some improvement.  However, with ongoing pain went to the ER on 04/10/2018.  Had only 1 day of loose, nonbloody, non-watery stools on 9/30, but otherwise bowel habits have remained unchanged.  No previous similar symptoms.  No recent international travel but does state they were on vacation 3 weeks prior and 1 week prior and admits to dietary indiscretions including increased dairy intake.  Otherwise, she does not consume any dairy and has a gluten reduced intake chronically.  Tends to otherwise eat healthy and exercise regularly.  Dark stools with Pepto, but o/w no overt GI blood loss.   She went to [facility] emergency department on 04/10/2018 for the above symptoms.  Evaluation was largely unrevealing to include normal CMP, CBC, lipase, UA and hCG was negative.  CT notable for normal-appearing bowel, gallbladder, pancreas, and spleen, but there were incidentally noted hepatic cysts (largest in right mid lobe measuring 2 x 2 cm), hepatic steatosis, and a mass in the left lobe of the liver measuring 1.6 x 1.1 cm which appeared benign per radiologist report.  She was treated with GI cocktail, Pepcid, Zofran, Carafate, IV fluids, and discharged with Zofran, Protonix 20 mg daily, Carafate QID.   Since ER, improvement with Carafate.  Has only taken Protonix  once.  Now tolerating PO and activities.  No previous EGD or colonoscopy.  No known family history of CRC, GI malignancy, liver disease, pancreatic disease, or IBD.    Past Medical History:  Diagnosis Date  . No pertinent past medical history      Past Surgical History:  Procedure Laterality Date  . I&D EXTREMITY  05/27/2011   Procedure: IRRIGATION AND DEBRIDEMENT EXTREMITY;  Surgeon: Eulas Post;  Location: [facility] OR;  Service: Orthopedics;  Laterality: Left;  . ORTHOPEDIC SURGERY    . TIBIA IM NAIL INSERTION  05/27/2011   Procedure: INTRAMEDULLARY (IM) NAIL TIBIAL;  Surgeon: Eulas Post;  Location: [facility] OR;  Service: Orthopedics;  Laterality: Left;   No family history on file. Social History   Tobacco Use  . Smoking status: Never Smoker  . Smokeless tobacco: Never Used  Substance Use Topics  . Alcohol use: No  . Drug use: No   Current Outpatient Medications  Medication Sig Dispense Refill  . HYDROcodone-acetaminophen (NORCO) 5-325 MG tablet Take 1 tablet by mouth every 6 (six) hours as needed (for pain). 20 tablet 0  . ondansetron (ZOFRAN ODT) 4 MG disintegrating tablet Take 1 tablet (4 mg total) by mouth every 8 (eight) hours as needed for nausea or vomiting. 8 tablet 0  . pantoprazole (PROTONIX) 20 MG tablet Take 1 tablet (20 mg total) by mouth daily. 14 tablet 0  . penicillin v potassium (VEETID) 500 MG tablet Take 1 tablet (500 mg  total) by mouth 4 (four) times daily. 40 tablet 0  . sucralfate (CARAFATE) 1 GM/10ML suspension Take 10 mLs (1 g total) by mouth 4 (four) times daily -  with meals and at bedtime. 420 mL 0   No current facility-administered medications for this visit.    No Known Allergies   Review of Systems: All systems reviewed and negative except where noted in HPI.     Physical Exam:    Wt Readings from Last 3 Encounters:  04/12/18 163 lb (73.9 kg)  04/10/18 160 lb (72.6 kg)  06/06/17 170 lb 10.2 oz (77.4 kg)    Ht 5\' 5"  (1.651 m)   Wt  163 lb (73.9 kg)   LMP 12/08/2017   BMI 27.12 kg/m  Constitutional:  Pleasant, in no acute distress. Psychiatric: Normal mood and affect. Behavior is normal. EENT: Pupils normal.  Conjunctivae are normal. No scleral icterus. Neck supple. No cervical LAD. Cardiovascular: Normal rate, regular rhythm. No edema Pulmonary/chest: Effort normal and breath sounds normal. No wheezing, rales or rhonchi. Abdominal: Soft, nondistended, nontender. Bowel sounds active throughout. There are no masses palpable. No hepatomegaly. Neurological: Alert and oriented to person place and time. Skin: Skin is warm and dry. No rashes noted.   ASSESSMENT AND PLAN;   Christine Archer is a 47 y.o. female presenting with:  1) abdominal pain: Abrupt onset generalized abdominal pain with associated nausea with some relief with Pepto-Bismol and now overall clinical improvement since starting Carafate by ER.  CT without any GI luminal pathology.  Discussed DDX at length to include PUD, gastritis, viral infection, H. pylori, dietary indiscretion with altered bowel handling, etc. and will evaluate and treat as below -Okay to resume Carafate - Take Protonix as prescribed for 4 weeks for possible gastritis - We will check H. pylori for test and treat method -Discussed role of EGD for PUD, gastritis, H. pylori, etc.  She would like to hold off on this for now.  Given clinical improvement and patient reliability, this seems reasonable.  If H. pylori negative and not improving, can plan on EGD to evaluate for mucosal etiology.  2) Hepatic cyst and single hepatic mass: Recent CT abdomen in ER for abdominal pain was incidentally noted hepatic steatosis, multiple hepatic cysts and a 1.6 x 1.1 cm benign-appearing mass in the left lobe of the liver.  No previous hepatobiliary imaging for comparison.  Discussed these incidental findings at length with patient and her husband. - MRI three-phase liver for full characterization of this  mass.  Depending on MRI results, can consider repeat imaging in 6 to 12 months to ensure size stability versus further evaluation depending on characteristics on MRI  3) Hepatic steatosis: Fatty liver infiltration noted on recent CT.  Liver enzymes otherwise normal.  No clinical or serologic evidence of impaired hepatic synthetic function.  Discussed NAFLD at length and she already employs regular exercise and healthy eating habits.  Does not drink coffee.  Discussed risks of progression to NASH/cirrhosis.  Can follow with periodic liver enzymes.  4) CRC screening: -Colonoscopy indicated for age-appropriate average for screening in this 47 year old African-American female  The indications, risks, and benefits of colonoscopy were explained to the patient in detail. Risks include but are not limited to bleeding, perforation, adverse reaction to medications, and cardiopulmonary compromise. Sequelae include but are not limited to the possibility of surgery, hospitalization, and mortality. The patient verbalized understanding and wished to proceed. All questions answered, referred to the scheduler and bowel prep ordered.  Further recommendations pending results of the exam.   RTC in 3 months or sooner as needed   Verlin Dike Jyaire Koudelka, DO, FACG  04/12/2018, 10:11 AM   No ref. provider found

## 2018-04-12 NOTE — Patient Instructions (Addendum)
If you are age 45 or older, your body mass index should be between 23-30. Your Body mass index is 27.12 kg/m. If this is out of the aforementioned range listed, please consider follow up with your Primary Care Provider.  If you are age 49 or younger, your body mass index should be between 19-25. Your Body mass index is 27.12 kg/m. If this is out of the aformentioned range listed, please consider follow up with your Primary Care Provider.   You have been scheduled for a colonoscopy. Please follow written instructions given to you at your visit today.  Please pick up your prep supplies at the pharmacy within the next 1-3 days. If you use inhalers (even only as needed), please bring them with you on the day of your procedure. Your physician has requested that you go to www.startemmi.com and enter the access code given to you at your visit today. This web site gives a general overview about your procedure. However, you should still follow specific instructions given to you by our office regarding your preparation for the procedure.  We have sent the following medications to your pharmacy for you to pick up at your convenience: Suprep  You have been scheduled for an MRI at [facility] on 04/24/2018. Your appointment time is 8:00am. Please arrive 15 minutes prior to your appointment time for registration purposes. Please make certain not to have anything to eat or drink 6 hours prior to your test. In addition, if you have any metal in your body, have a pacemaker or defibrillator, please be sure to let your ordering physician know. This test typically takes 45 minutes to 1 hour to complete. Should you need to reschedule, please call [phone] to do so.  Please go to the lab on the 2nd floor [address] before you leave the office today.   It was a pleasure to see you today!  Vito Cirigliano, D.O.

## 2018-04-17 NOTE — Progress Notes (Signed)
Notified pt of negative lab for H Pylori. Patient was pleased and verbalized understanding.

## 2018-04-24 ENCOUNTER — Ambulatory Visit (HOSPITAL_COMMUNITY): Admission: RE | Admit: 2018-04-24 | Payer: 59 | Source: Ambulatory Visit

## 2018-05-02 ENCOUNTER — Telehealth: Payer: Self-pay | Admitting: Gastroenterology

## 2018-05-03 ENCOUNTER — Ambulatory Visit (HOSPITAL_COMMUNITY): Admission: RE | Admit: 2018-05-03 | Payer: 59 | Source: Ambulatory Visit

## 2018-05-03 NOTE — Telephone Encounter (Signed)
Patient notified via vm to contact [phone] Central scheduling to reschedule at her convenience.

## 2018-05-04 ENCOUNTER — Encounter: Payer: Self-pay | Admitting: Gastroenterology

## 2018-05-04 ENCOUNTER — Ambulatory Visit (INDEPENDENT_AMBULATORY_CARE_PROVIDER_SITE_OTHER): Payer: 59 | Admitting: Gastroenterology

## 2018-05-04 VITALS — BP 110/78 | HR 65 | Ht 65.0 in | Wt 163.0 lb

## 2018-05-04 DIAGNOSIS — R1084 Generalized abdominal pain: Secondary | ICD-10-CM

## 2018-05-04 DIAGNOSIS — K760 Fatty (change of) liver, not elsewhere classified: Secondary | ICD-10-CM

## 2018-05-04 DIAGNOSIS — Z1211 Encounter for screening for malignant neoplasm of colon: Secondary | ICD-10-CM | POA: Diagnosis not present

## 2018-05-04 DIAGNOSIS — K7689 Other specified diseases of liver: Secondary | ICD-10-CM

## 2018-05-04 NOTE — Progress Notes (Signed)
P  Chief Complaint:    Abdominal pain  GI History: 47 year old female initially seen in the GI clinic on 04/12/2018 for generalized abdominal pain x9 days.  Pain described as gas-like pains typically starting post p.o. intake, and improved with flatus, belch, or BM.  Symptoms responded to trial of Pepto-Bismol and OTC antacids and largely asymptomatic by the time of follow-up with me.  H. pylori negative.  ER evaluation for the symptoms on 04/10/2018 was largely unrevealing except for incidentally noted hepatic cysts (largest in right mid lobe measuring 2 x 2 cm), hepatic steatosis, and a mass in the left lobe of the liver measuring 1.6 x 1.1 cm which appeared benign per radiologist report.  Ordered MRI three-phase liver at that time, which is not been completed yet.  HPI:     Patient is a 47 y.o. female presenting to the Gastroenterology Clinic for follow-up.  She was initially seen by me on 04/12/2018 for generalized abdominal pain x9 days along with incidentally noted hepatic cysts from ER evaluation on 04/10/2018.  H. pylori negative.  MRI liver was ordered at her previous appointment but she has not completed yet-scheduled for 05/09/2018.  Previously recommended colonoscopy for age-appropriate average risk screening and a 47 year old African-American female.  Not yet completed.  Today she states she completed the Protonix and nearly done with Carafate, with overall clinical improvement. However, with recurrence of abdominal discomfort a few days ago-again described as gas like cramping, intermittent, not as severe. Occurs post PO. Avoiding dairy and gluten-containing foods. Improved with Carafate and OTC antacid (Pepcid). No nausea. Again resolves with belch or flatus, lasting 5 mins or so.  No associated GI bleed.  Aside from the negative H. pylori, no new labs or rads for review today.  Review of systems:     No chest pain, no SOB, no fevers, no urinary sx   Past Medical History:    Diagnosis Date  . No pertinent past medical history     Patient's surgical history, family medical history, social history, medications and allergies were all reviewed in Epic    Current Outpatient Medications  Medication Sig Dispense Refill  . HYDROcodone-acetaminophen (NORCO) 5-325 MG tablet Take 1 tablet by mouth every 6 (six) hours as needed (for pain). 20 tablet 0  . ondansetron (ZOFRAN ODT) 4 MG disintegrating tablet Take 1 tablet (4 mg total) by mouth every 8 (eight) hours as needed for nausea or vomiting. 8 tablet 0  . pantoprazole (PROTONIX) 20 MG tablet Take 1 tablet (20 mg total) by mouth daily. 14 tablet 0  . penicillin v potassium (VEETID) 500 MG tablet Take 1 tablet (500 mg total) by mouth 4 (four) times daily. 40 tablet 0  . sucralfate (CARAFATE) 1 GM/10ML suspension Take 10 mLs (1 g total) by mouth 4 (four) times daily -  with meals and at bedtime. 420 mL 0   No current facility-administered medications for this visit.     Physical Exam:     LMP 12/08/2017   GENERAL:  Pleasant female in NAD PSYCH: : Cooperative, normal affect EENT:  conjunctiva pink, mucous membranes moist, neck supple without masses CARDIAC:  RRR, no murmur heard, no peripheral edema PULM: Normal respiratory effort, lungs CTA bilaterally, no wheezing ABDOMEN:  Nondistended, soft, nontender. No obvious masses, no hepatomegaly,  normal bowel sounds SKIN:  turgor, no lesions seen Musculoskeletal:  Normal muscle tone, normal strength NEURO: Alert and oriented x 3, no focal neurologic deficits   IMPRESSION and PLAN:    #  1. Hepatic cyst and single hepatic mass: Recent CT abdomen in ER for abdominal pain was incidentally noted hepatic steatosis, multiple hepatic cysts and a 1.6 x 1.1 cm benign-appearing mass in the left lobe of the liver.  No previous hepatobiliary imaging for comparison.  Discussed these incidental findings at length with patient and her husband. - MRI three-phase liver previously  ordered and scheduled for next week for further characterization of this mass.  Depending on MRI results, can consider repeat imaging in 6 to 12 months to ensure size stability versus further evaluation depending on characteristics on MRI     #2.  Hepatic steatosis: Fatty liver infiltration noted on recent CT.  Liver enzymes otherwise normal.  No clinical or serologic evidence of impaired hepatic synthetic function.    Previously discussed NAFLD at length and she already employs regular exercise and healthy eating habits.  Does not drink coffee.  Discussed risks of progression to NASH/cirrhosis.  Can follow with periodic liver enzymes.  #3.  CRC screening: -Colonoscopy indicated for age-appropriate average for screening in this 47 year old African-American female.  Previously scheduled for colonoscopy but not yet completed.  #4.  Abdominal pain: Recurrence of index abdominal discomfort, gas-like cramping pain.  Symptoms related to p.o. intake.  Symptoms most consistent with functional abdominal pain.  Will treat as below: -Trial course of Gas X, Beano, simethicone OTC, etc prn - Low FODMAP diet.  Provided with handout today - Discussed EGD to eval for mucosal/luminal etiology with gastric and duodenal bxs, but she would prefer to hold off on this for now - If not improving/worsening, then will proceed with EGD - If no change, can also trial course of FD Guard - Can also consider trial of Bentyl or Symax.  Do not feel trial of SSRI or TCA is needed at this time.  I spent a total of 25 minutes of face-to-face time with the patient. Greater than 50% of the time was spent counseling and coordinating care.    Shellia Cleverly ,DO, FACG 05/04/2018, 8:55 AM

## 2018-05-04 NOTE — Patient Instructions (Signed)
If you are age 45 or older, your body mass index should be between 23-30. Your Body mass index is 27.12 kg/m. If this is out of the aforementioned range listed, please consider follow up with your Primary Care Provider.  If you are age 36 or younger, your body mass index should be between 19-25. Your Body mass index is 27.12 kg/m. If this is out of the aformentioned range listed, please consider follow up with your Primary Care Provider.   Please purchase the following medications over the counter and take as directed:  Simethicone chewable tablets What is this medicine? SIMETHICONE (sye METH i kone) is used to decrease the discomfort caused by gas. This medicine may be used for other purposes; ask your health care provider or pharmacist if you have questions. COMMON BRAND NAME(S): Gas Relief, Gas-X, Gas-X Extra Strength, Gas-X with Maalox, Genasyme, Maalox Max, Maalox Max Plus [place], Mylanta Gas, Mytab Gas, Phazyme, Titralac Plus, Bean-O How should I use this medicine? Take this medicine by mouth. Crush or chew the tablets. Do not swallow them whole. Follow the directions on the label or those given to you by your doctor or health care professional. Do not take your medicine more often than directed.  It was a pleasure to see you today!  Vito Cirigliano, D.O.

## 2018-05-09 ENCOUNTER — Ambulatory Visit (HOSPITAL_COMMUNITY): Admission: RE | Admit: 2018-05-09 | Payer: 59 | Source: Ambulatory Visit

## 2018-05-11 ENCOUNTER — Encounter: Payer: Self-pay | Admitting: Gastroenterology

## 2018-05-25 ENCOUNTER — Encounter: Payer: 59 | Admitting: Gastroenterology

## 2018-06-01 ENCOUNTER — Telehealth: Payer: Self-pay | Admitting: Gastroenterology

## 2018-06-01 NOTE — Telephone Encounter (Signed)
Patient is scheduled for a follow up appointment for tomorrow at 10:30am but wants to know if there is anything we can give her until then to help?

## 2018-06-02 ENCOUNTER — Encounter: Payer: Self-pay | Admitting: Gastroenterology

## 2018-06-02 ENCOUNTER — Ambulatory Visit: Payer: 59 | Admitting: Gastroenterology

## 2018-06-02 ENCOUNTER — Ambulatory Visit
Admission: RE | Admit: 2018-06-02 | Discharge: 2018-06-02 | Disposition: A | Discharge status: Home / Self Care | Payer: 59 | Source: Ambulatory Visit | Attending: Family Medicine | Admitting: Family Medicine

## 2018-06-02 ENCOUNTER — Other Ambulatory Visit: Payer: Self-pay | Admitting: Family Medicine

## 2018-06-02 VITALS — BP 108/62 | HR 68 | Ht 65.0 in | Wt 160.0 lb

## 2018-06-02 DIAGNOSIS — R079 Chest pain, unspecified: Secondary | ICD-10-CM

## 2018-06-02 DIAGNOSIS — K760 Fatty (change of) liver, not elsewhere classified: Secondary | ICD-10-CM | POA: Diagnosis not present

## 2018-06-02 DIAGNOSIS — Z1211 Encounter for screening for malignant neoplasm of colon: Secondary | ICD-10-CM | POA: Diagnosis not present

## 2018-06-02 DIAGNOSIS — R1084 Generalized abdominal pain: Secondary | ICD-10-CM

## 2018-06-02 DIAGNOSIS — R1013 Epigastric pain: Secondary | ICD-10-CM | POA: Diagnosis not present

## 2018-06-02 DIAGNOSIS — K769 Liver disease, unspecified: Secondary | ICD-10-CM

## 2018-06-02 DIAGNOSIS — Z1212 Encounter for screening for malignant neoplasm of rectum: Secondary | ICD-10-CM

## 2018-06-02 MED ORDER — SUCRALFATE 1 GM/10ML PO SUSP: 1.0000 g | Freq: Four times a day (QID) | ORAL | 3 refills | Status: DC

## 2018-06-02 NOTE — Progress Notes (Signed)
P  Chief Complaint:    Abdominal pain  GI History: 47 year old female initially seen in the GI clinic on 04/12/2018 for generalized abdominal pain x9 days.  Pain described as gas-like pains typically starting post p.o. intake, and improved with flatus, belch, or BM.  Symptoms responded to trial of Pepto-Bismol and OTC antacids and largely asymptomatic by the time of appointment with me.  H. pylori negative.  ER evaluation for the symptoms on 04/10/2018 was largely unrevealing except for incidentally noted hepatic cysts (largest in right mid lobe measuring 2 x 2 cm), hepatic steatosis, and a mass in the left lobe of the liver measuring 1.6 x 1.1 cm which appeared benign per radiologist report.  Ordered MRI three-phase liver at that time, which is not been completed yet.  She return for follow-up on 05/04/2018 with recurrence of abdominal discomfort, again described as gas-like cramping, intermittent, not as severe.  Symptoms occur postprandial.  At that time, was avoiding dairy and gluten-containing foods.  Symptoms improved with Carafate and OTC antacid (Pepcid).  No associated nausea and symptoms resolved with belching or flatus, lasting 5 minutes or so.  At that appointment, trialed course of simethicone OTC, low FODMAP diet.  Offered EGD which she politely declined.    Also offered colonoscopy for age-appropriate CRC screening, which is scheduled for 06/14/2018.  She has still not completed the MRI liver.  HPI:     Patient is a 47 y.o. female presenting to the Gastroenterology Clinic for follow-up.  She was last seen by me on 05/04/2018 for recurrence of her index abdominal pain.  Trial a course of simethicone OTC, IBGuard, and low FODMAP diet.  Today she states the simethicone worked well for sxs, but does not like the sensation of not having belch or flatus, as she has improvement in sxs with these. Improvement with peppermint tea. Sxs still responsive to Pepto prn. No nausea. Worse with trial  of OTC IBGuard. Had improvement with carafate in the past and requesting refill today. Has stuck with low FODMAP diet with overall improvement.   Still no change in bowel habits and no GI blood loss.  Recently diagnosed with pleurisy- treated with tumeric. Feels the tumeric has also helped with her GI sxs.    Review of systems:     No chest pain, no SOB, no fevers, no urinary sx   Past Medical History:  Diagnosis Date  . No pertinent past medical history     Patient's surgical history, family medical history, social history, medications and allergies were all reviewed in Epic    Current Outpatient Medications  Medication Sig Dispense Refill  . HYDROcodone-acetaminophen (NORCO) 5-325 MG tablet Take 1 tablet by mouth every 6 (six) hours as needed (for pain). 20 tablet 0  . ondansetron (ZOFRAN ODT) 4 MG disintegrating tablet Take 1 tablet (4 mg total) by mouth every 8 (eight) hours as needed for nausea or vomiting. 8 tablet 0   No current facility-administered medications for this visit.     Physical Exam:     BP 108/62   Pulse 68   Ht 5\' 5"  (1.651 m)   Wt 160 lb (72.6 kg)   LMP 12/08/2017   BMI 26.63 kg/m   GENERAL:  Pleasant female in NAD PSYCH: : Cooperative, normal affect EENT:  conjunctiva pink, mucous membranes moist, neck supple without masses CARDIAC:  RRR, no murmur heard, no peripheral edema PULM: Normal respiratory effort, lungs CTA bilaterally, no wheezing ABDOMEN:  Nondistended, soft, nontender.  No obvious masses, no hepatomegaly,  normal bowel sounds SKIN:  turgor, no lesions seen Musculoskeletal:  Normal muscle tone, normal strength NEURO: Alert and oriented x 3, no focal neurologic deficits   IMPRESSION and PLAN:    #1. Hepatic cyst and single hepatic mass: Recent CT abdomen in ER for abdominal pain was incidentally noted hepatic steatosis, multiple hepatic cysts and a 1.6 x 1.1 cm benign-appearing mass in the left lobe of the liver. No previous  hepatobiliary imaging for comparison.Previously discussed these incidental findings at length with patient and her husband. - MRI three-phase liver previously ordered.  Was scheduled for earlier this month, but not completed.  I reiterated this recommendation for an advanced imaging study for further characterization of this mass. - Depending on MRI results, can consider repeat imaging in 6 to 12 months to ensure size stability versus further evaluation depending on characteristics on MRI    #2.  Hepatic steatosis: Fatty liver infiltration noted on recent CT. Liver enzymes otherwise normal. No clinical or serologic evidence of impaired hepatic synthetic function.  Previously discussed NAFLDat length and she already employs regular exercise and healthy eating habits. Does not drink coffee. Discussed risks of progression to NASH/cirrhosis. Can follow with periodic liver enzymes. - Repeat liver enzymes in 6 months.  #3.  CRC screening: Colonoscopy indicated for age-appropriate average for screening in this 47 year old African-American female.   - Colonoscopy scheduled for 06/14/2018.  #4.  Abdominal pain: Clinical presentation most c/w functional abdominal pain/non-ulcer dyspepsia. She has had clinical improvement with some of the interventions to date, but still with sxs that occur on most days, just reduced severity and duration it seems. Will  continue to eval and tx as below:  - EGD to eval for PUD, gastritis, or other luminal or mucosal etiology for ongoing sxs. Will plan on gastric and duodenal bxs. Can add on to already-scheduled colonoscopy for 12/4 - Ordered carafate with 3 RFs today - Trial probiotic - Resume low FODMAP diet - Previously discussed trial of Bentyl or Symax.  Do not feel trial of SSRI or TCA is needed at this time. She'd like to hold off at this time to gauge improvement with above interventions  The indications, risks, and benefits of EGD and colonoscopy were  explained to the patient in detail. Risks include but are not limited to bleeding, perforation, adverse reaction to medications, and cardiopulmonary compromise. Sequelae include but are not limited to the possibility of surgery, hositalization, and mortality. The patient verbalized understanding and wished to proceed. All questions answered, referred to scheduler and bowel prep ordered. Further recommendations pending results of the exam.   I spent a total of 25 minutes of face-to-face time with the patient. Greater than 50% of the time was spent counseling and coordinating care.         Shellia CleverlyVito V Natlie Asfour ,DO, FACG 06/02/2018, 10:51 AM

## 2018-06-02 NOTE — Addendum Note (Signed)
Addended by: Oliver BarreOLE, Keymiah Lyles D on: 06/02/2018 04:38 PM   Modules accepted: Orders

## 2018-06-02 NOTE — Patient Instructions (Addendum)
If you are age 45 or older, your body mass index should be between 23-30. Your Body mass index is 26.63 kg/m. If this is out of the aforementioned range listed, please consider follow up with your Primary Care Provider.  If you are age 45 or younger, your body mass index should be between 19-25. Your Body mass index is 26.63 kg/m. If this is out of the aformentioned range listed, please consider follow up with your Primary Care Provider.   You have been scheduled for an endoscopy and colonoscopy. Please follow the written instructions given to you at your visit today. Please pick up your prep supplies at the pharmacy within the next 1-3 days. If you use inhalers (even only as needed), please bring them with you on the day of your procedure. Your physician has requested that you go to www.startemmi.com and enter the access code given to you at your visit today. This web site gives a general overview about your procedure. However, you should still follow specific instructions given to you by our office regarding your preparation for the procedure.  We have sent the following medications to your pharmacy for you to pick up at your convenience: Carafate drink 10ml every 6 hours as needed for pain.  You have been give a sample of  Carafate (2 boxes)  It was a pleasure to see you today!  Vito Cirigliano, D.O.

## 2018-06-14 ENCOUNTER — Encounter: Payer: 59 | Admitting: Gastroenterology

## 2018-10-09 ENCOUNTER — Telehealth: Payer: Self-pay | Admitting: Gastroenterology

## 2018-10-09 NOTE — Telephone Encounter (Signed)
No answer. Needing details. Left message to call back.

## 2018-10-09 NOTE — Telephone Encounter (Signed)
Patient reports a return of bloated abdomen and feeling, belching, and acid stomach. Her stomach is "noisey" all the time. She has a "burning" sensation in her throat and stomach. She is not eating much because everything increases her symptoms. She is taking Carafate twice a day.  She did not reschedule the procedures because she was doing well. She admits to stress with the recent COVID19. Her spouse is a Emergency planning/management officer and she worries about him. She has started medication for the stress anxiety issue. Please advise.

## 2018-10-09 NOTE — Telephone Encounter (Signed)
I called and spoke with Christine Archer. She states she was doing very well from a GI standpoint, with resolution of sxs since last appt in fall 2019. Did not completed EGD/colonoscopy due to resolution of sxs.   Over last couple weeks has had increasing GI sxs described as upset stomach, bloating, abdominal discomfort, gas, belching, regurgitation. Had been off low FODMAP diet recently as well, which she reports has certainly contributed to her sxs. Does admit to increased stress with COVID-19, particularly worrying about her husband who is a Emergency planning/management officer. Took Carafate which provides brief improvement. PCM states this could be adrenal fatigue.   Otherwise, doing well staying at home amid the COVID-19 pandemic and avoiding exposures.    Sxs seem to be related to both stress related to COVID-19 as well as dietary indiscretions. Plan to treat with stress coping, information, peppermint Altoids, and restarting low FODMAP diet. Will place refill for Carafate to use prn.   Heather,  Can you please place a refill for her RX of Carafate 10 mg prn every 6 hours as needed for pain, abdominal discomfort. Thank you.

## 2018-10-09 NOTE — Telephone Encounter (Signed)
Pt said she is having her same symptoms of when she had a stomach ulcer. Would like to know what she should do.

## 2018-10-10 ENCOUNTER — Other Ambulatory Visit: Payer: Self-pay

## 2018-10-10 MED ORDER — SUCRALFATE 1 GM/10ML PO SUSP: 1.0000 g | Freq: Four times a day (QID) | ORAL | 3 refills | Status: DC | PRN

## 2018-10-10 NOTE — Telephone Encounter (Signed)
Medication sent to Pharmacy.  

## 2019-06-14 ENCOUNTER — Telehealth: Payer: Self-pay

## 2019-06-14 NOTE — Telephone Encounter (Signed)
Spoke to patient who states that she did not have her MRI last year when scheduled due  to family responsibilities. She is requesting an office visit to discus this first, and then will schedule the MRI. Appointment has been made for January.

## 2019-06-14 NOTE — Telephone Encounter (Signed)
-----   Message from [place], DO sent at 06/11/2019  7:51 AM EST ----- Patient was supposed to have a MRI Liver last years. Inbox message indicates this was never completed, and order due to expire soon. Please f/u with this patient to see if this has been done at outside facility, or if she needs f/u appt to review. Was ordered as a f/u to an incidentally noted liver finidng on CT. Thanks.

## 2019-07-03 IMAGING — CT CT ABD-PELV W/ CM
2 of 5 series · 16 of 46 positions shown, 18 images · IV contrast (APPLIED)
Comparison: None.

CLINICAL DATA: Generalized abdominal pain with diarrhea

EXAM:
CT ABDOMEN AND PELVIS WITH CONTRAST
TECHNIQUE: Multidetector CT imaging of the abdomen and pelvis was performed
using the standard protocol following bolus administration of
intravenous contrast.
CONTRAST:  100mL DITO05-ZYY IOPAMIDOL (DITO05-ZYY) INJECTION 61%

[Series 2: axial st · axial · 0.71mm/px · z∈[-522,-112]mm · 13 of 92 slices shown, 15 images]
[im 5/92  soft-tissue]
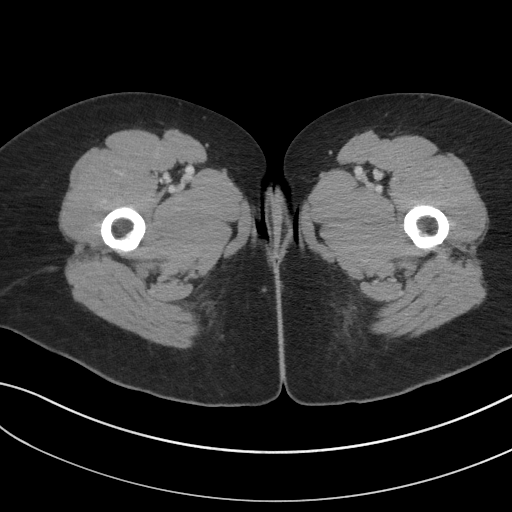
[im 5/92  bone]
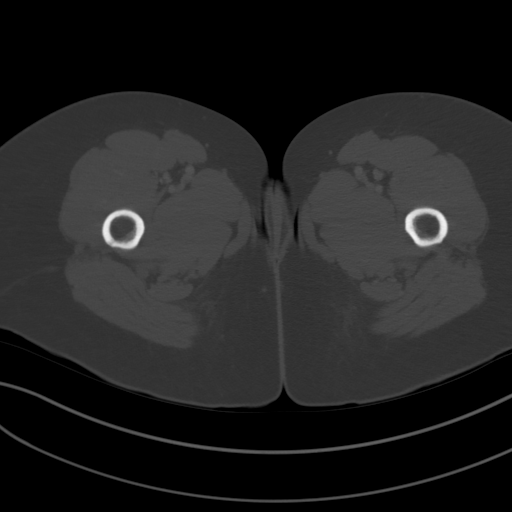
[im 15/92  soft-tissue]
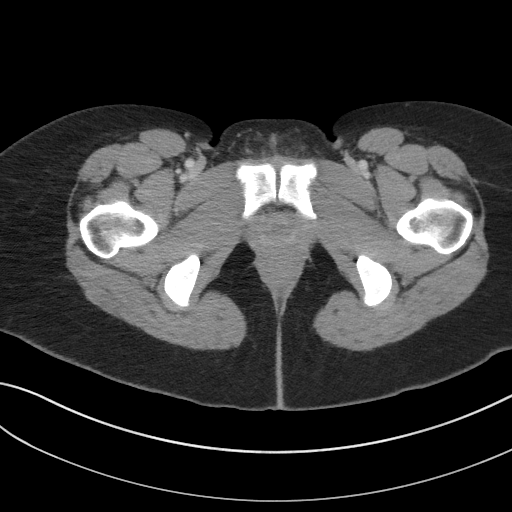
[im 20/92  soft-tissue]
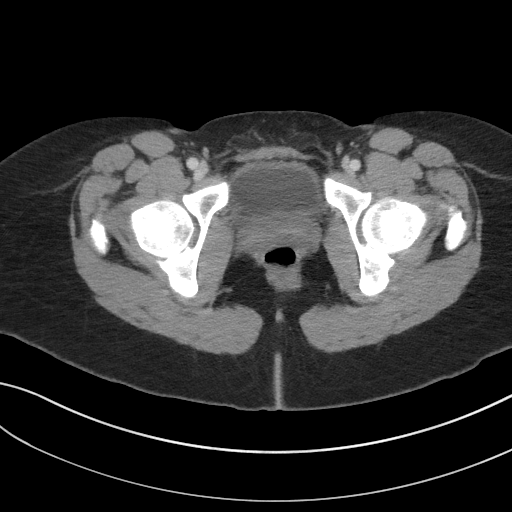
[im 24/92  soft-tissue]
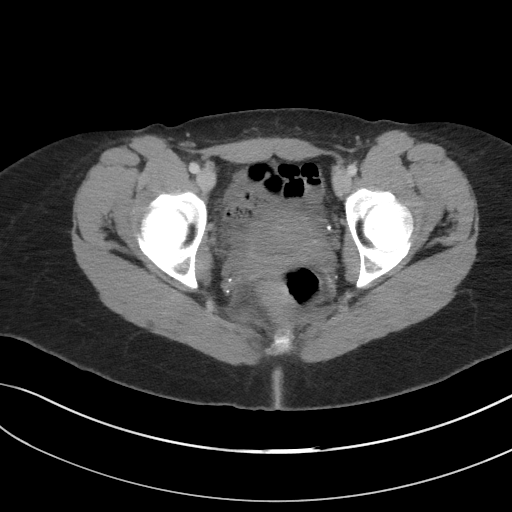
[im 34/92  soft-tissue]
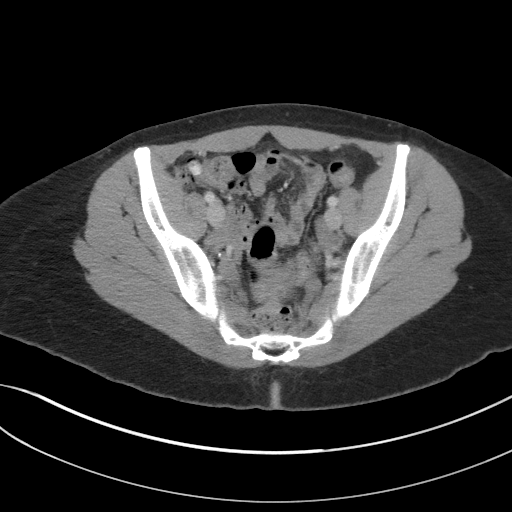
[im 39/92  soft-tissue]
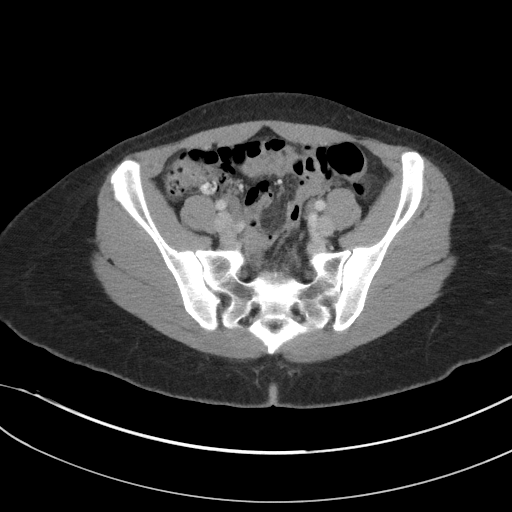
[im 48/92  soft-tissue]
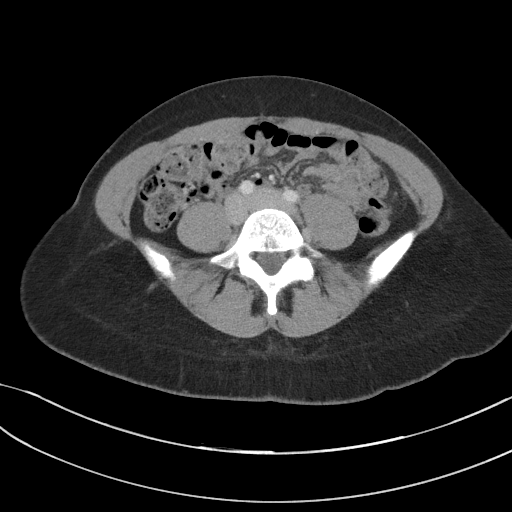
[im 53/92  soft-tissue]
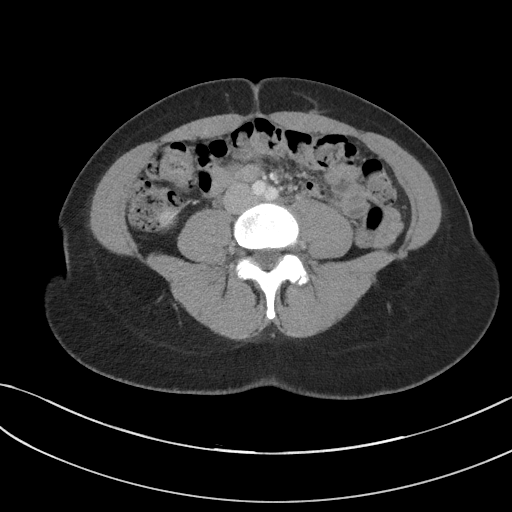
[im 58/92  soft-tissue]
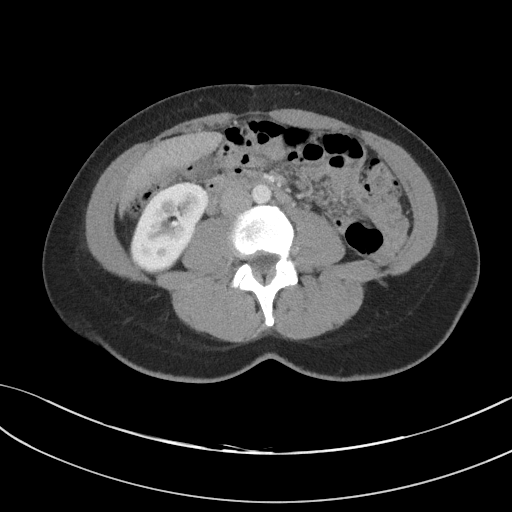
[im 58/92  bone]
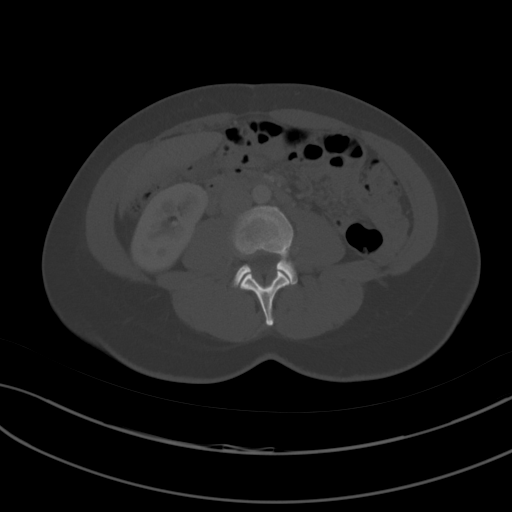
[im 68/92  soft-tissue]
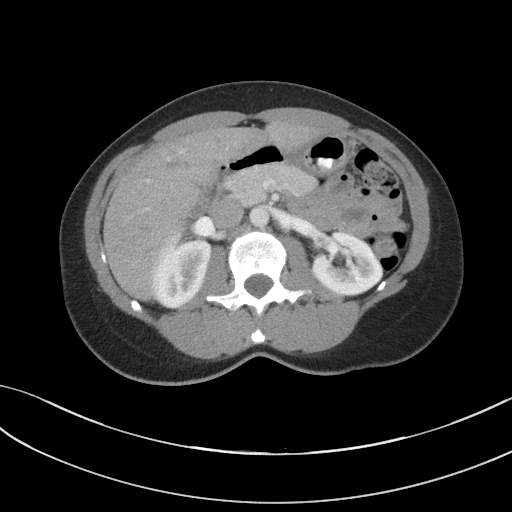
[im 72/92  soft-tissue]
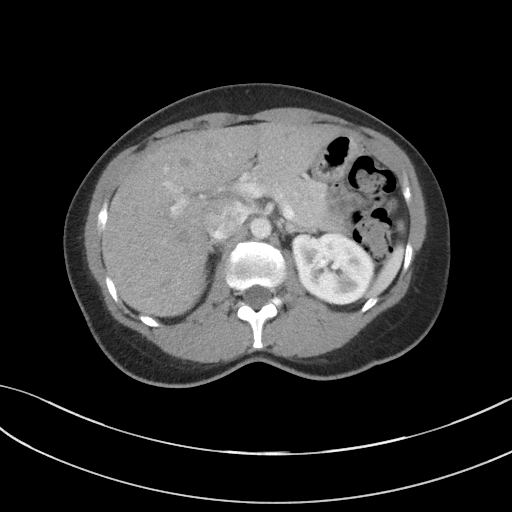
[im 77/92  soft-tissue]
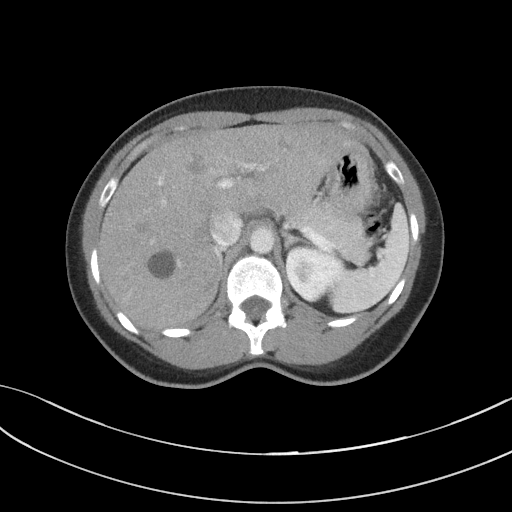
[im 87/92  soft-tissue]
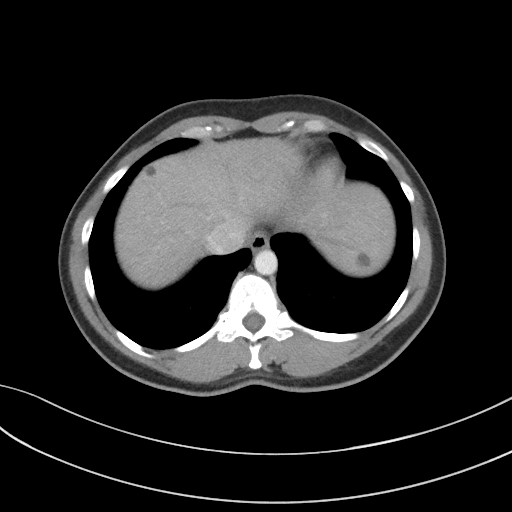

[Series 5: coronal st · coronal · 0.84mm/px · 3 of 78 slices shown]
[im 26/78  soft-tissue]
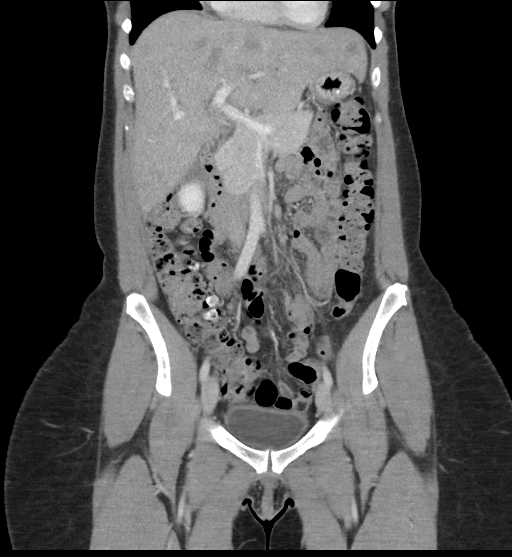
[im 35/78  soft-tissue]
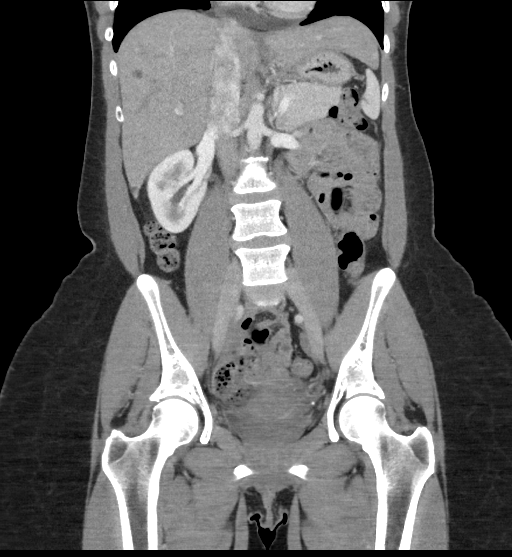
[im 43/78  soft-tissue]
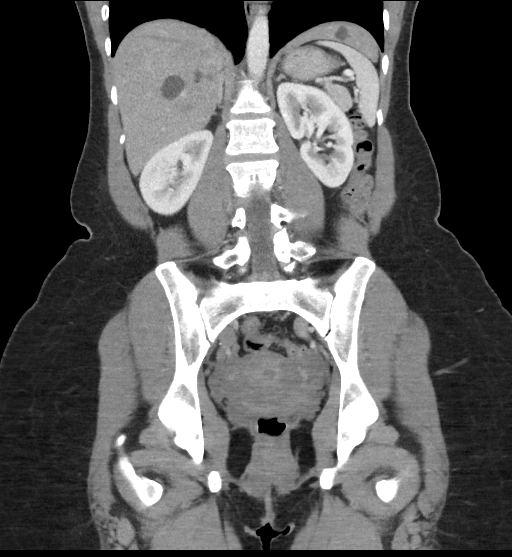

[16 of 46 positions shown; findings below may reference images not displayed]

FINDINGS: Lower chest: Lung lung bases are clear.

Hepatobiliary: There is underlying hepatic steatosis. There are
cysts scattered throughout the liver. The largest individual cyst is
in the mid right lobe region measuring 2.0 x 2.0 cm. There is a mass
arising in the left lobe of the liver measuring 1.6 x 1.1 cm which
cannot be classified as a cyst. It appears unchanged on delayed
images compared to venous phase images. Gallbladder wall is not
appreciably thickened. No biliary duct dilatation.

Pancreas: There is no evident pancreatic mass or inflammatory focus.

Spleen: No splenic lesions are evident.

Adrenals/Urinary Tract: Adrenals bilaterally appear unremarkable.
There is a 7 x 7 mm cyst in the lateral lower pole left kidney.
There is no evident hydronephrosis on either side. There is no renal
or ureteral calculus apparent on either side. Urinary bladder is
midline with wall thickness within normal limits for degree of
distention.

Stomach/Bowel: There is no appreciable bowel wall or mesenteric
thickening. There is no evident bowel obstruction. There is no
appreciable free air or portal venous air.

Vascular/Lymphatic: There is no abdominal aortic aneurysm. No
vascular lesions are evident. There is no evident adenopathy in the
abdomen or pelvis.

Reproductive: Uterus is retroverted. No pelvic mass evident. There
is a small amount of free fluid in the rightward aspect of the
cul-de-sac.

Other: Appendix not well seen. No periappendiceal region
inflammation evident. No abscess evident in the abdomen pelvis.
There is no ascites beyond the slight fluid seen in the cul-de-sac
region.

Musculoskeletal: No blastic or lytic bone lesions are appreciable.
There is no intramuscular or abdominal wall lesion.
IMPRESSION: 1. No evident bowel wall thickening or bowel obstruction. No abscess
evident in the abdomen or pelvis. No periappendiceal region
inflammatory change.

2.  No renal or ureteral calculi.  No hydronephrosis.

3. Small amount of free fluid in the cul-de-sac which is upper
physiologic in amount. Potentially this finding could indicate small
recent ovarian cyst rupture.

4. Hepatic steatosis with multiple liver cysts. There is a decreased
attenuation mass in the left lobe which cannot be classified as a
cyst which appears benign and does not show differential appearance
between venous phase and delayed phase imaging.

## 2019-07-18 ENCOUNTER — Ambulatory Visit: Payer: 59 | Admitting: Gastroenterology

## 2019-08-03 ENCOUNTER — Ambulatory Visit: Payer: 59 | Admitting: Gastroenterology

## 2020-10-17 ENCOUNTER — Telehealth: Payer: Self-pay | Admitting: Gastroenterology

## 2020-10-17 ENCOUNTER — Other Ambulatory Visit: Payer: Self-pay | Admitting: General Surgery

## 2020-10-17 MED ORDER — SUCRALFATE 1 G PO TABS: 1.0000 g | ORAL_TABLET | Freq: Three times a day (TID) | ORAL | 0 refills | Status: AC

## 2020-10-17 NOTE — Telephone Encounter (Signed)
The patient contacted the office back and stated she is having horrible reflux and would like the Carafate refilled. Offered her an earlier appointment to be seen by an app. She was scheduled with Gunnar Fusi on 10/22/2020.

## 2020-10-17 NOTE — Telephone Encounter (Signed)
Left a voicemail for the patient to contact me back regarding her request for a refill on sulcrafate. Her appointment is not scheduled with Dr Barron Alvine until 11/18/2020 need to see what her symptoms are and maybe schedule her with an APP.

## 2020-10-22 ENCOUNTER — Ambulatory Visit: Payer: 59 | Admitting: Nurse Practitioner

## 2020-11-18 ENCOUNTER — Ambulatory Visit: Payer: 59 | Admitting: Gastroenterology

## 2021-01-05 ENCOUNTER — Ambulatory Visit: Payer: 59 | Admitting: Gastroenterology

## 2022-10-07 ENCOUNTER — Encounter (HOSPITAL_BASED_OUTPATIENT_CLINIC_OR_DEPARTMENT_OTHER): Payer: Self-pay | Admitting: Emergency Medicine

## 2022-10-07 ENCOUNTER — Other Ambulatory Visit: Payer: Self-pay

## 2022-10-07 ENCOUNTER — Emergency Department (HOSPITAL_BASED_OUTPATIENT_CLINIC_OR_DEPARTMENT_OTHER)
Admission: EM | Admit: 2022-10-07 | Discharge: 2022-10-07 | Disposition: A | Discharge status: Home / Self Care | Payer: 59 | Attending: Emergency Medicine | Admitting: Emergency Medicine

## 2022-10-07 DIAGNOSIS — K0889 Other specified disorders of teeth and supporting structures: Secondary | ICD-10-CM | POA: Diagnosis not present

## 2022-10-07 MED ORDER — OXYCODONE HCL 5 MG PO TABS: 5.0000 mg | ORAL_TABLET | ORAL | 0 refills | Status: AC | PRN

## 2022-10-07 MED ORDER — ONDANSETRON HCL 4 MG PO TABS: 4.0000 mg | ORAL_TABLET | Freq: Four times a day (QID) | ORAL | 0 refills | Status: AC

## 2022-10-07 MED ORDER — CLINDAMYCIN HCL 150 MG PO CAPS: 150.0000 mg | ORAL_CAPSULE | Freq: Four times a day (QID) | ORAL | 0 refills | Status: AC

## 2022-10-07 NOTE — Discharge Instructions (Signed)
You were seen in the emergency department for dental pain.  I sent a prescription for antibiotics, pain medicine, nausea medication.  Continue to monitor how you're doing and return to the ER for new or worsening symptoms such as difficulty swallowing your own saliva, difficulty breathing, or fever.

## 2022-10-07 NOTE — ED Triage Notes (Signed)
Patient with dental pain.  She states that she has an appt with an oral surgeon next Wednesday for the wisdom tooth that needs to come out.  She also has a broken tooth.  She was trying to make it to next week, but the pain has become unbearable at this time.

## 2022-10-08 NOTE — ED Provider Notes (Signed)
[place] [facility] Provider Note   CSN: [id number] Arrival date & time: 10/07/22  2159     History  Chief Complaint  Patient presents with   Dental Pain    Christine Archer is a 52 y.o. female with no significant past medical history presents emergency department complaining of dental pain.  Patient has already been seen by a dentist, and is scheduled to have multiple teeth removed by an oral surgeon next week.  She reports having previously had a root canal that she never followed up about, without progressed to severe cavity and the following tooth, and has had impaction of her wisdom teeth.  She was already given a course of amoxicillin, tried some Norco which she felt made her too nauseous to continue, and she has been taking ibuprofen and Tylenol around-the-clock.  She says the pain is so severe that she cannot wait until her appointment next week.  She tried to call the dentist today, and they could not see her, and the office is closed for tomorrow and through the weekend.   Dental Pain      Home Medications Prior to Admission medications   Medication Sig Start Date End Date Taking? Authorizing Provider  clindamycin (CLEOCIN) 150 MG capsule Take 1 capsule (150 mg total) by mouth every 6 (six) hours. 10/07/22  Yes Brindle Leyba T, PA-C  ondansetron (ZOFRAN) 4 MG tablet Take 1 tablet (4 mg total) by mouth every 6 (six) hours. 10/07/22  Yes Wava Kildow T, PA-C  oxyCODONE (ROXICODONE) 5 MG immediate release tablet Take 1 tablet (5 mg total) by mouth every 4 (four) hours as needed for severe pain. 10/07/22  Yes Audric Venn T, PA-C  ondansetron (ZOFRAN ODT) 4 MG disintegrating tablet Take 1 tablet (4 mg total) by mouth every 8 (eight) hours as needed for nausea or vomiting. 04/10/18   Leaphart, Zack Seal, PA-C  sucralfate (CARAFATE) 1 g tablet Take 1 tablet (1 g total) by mouth 4 (four) times daily -  with meals and at bedtime for 14  days. 10/17/20 10/31/20  Cirigliano, Dominic Pea, DO      Allergies    Patient has no known allergies.    Review of Systems   Review of Systems  HENT:  Positive for dental problem. Negative for trouble swallowing.   All other systems reviewed and are negative.   Physical Exam Updated Vital Signs BP 119/72 (BP Location: Left Arm)   Pulse 87   Temp 97.6 F (36.4 C) (Oral)   Resp 18   Ht 5\' 5"  (1.651 m)   Wt 77.1 kg   LMP 12/08/2017   SpO2 100%   BMI 28.29 kg/m  Physical Exam Vitals and nursing note reviewed.  Constitutional:      Appearance: Normal appearance.  HENT:     Head: Normocephalic and atraumatic.     Mouth/Throat:     Pharynx: Oropharynx is clear. Uvula midline.     Tonsils: No tonsillar exudate or tonsillar abscesses.      Comments: No trismus, no sublingual or submandibular abscess Eyes:     Conjunctiva/sclera: Conjunctivae normal.  Pulmonary:     Effort: Pulmonary effort is normal. No respiratory distress.  Skin:    General: Skin is warm and dry.  Neurological:     Mental Status: She is alert.  Psychiatric:        Mood and Affect: Mood normal.        Behavior: Behavior normal.  ED Results / Procedures / Treatments   Labs (all labs ordered are listed, but only abnormal results are displayed) Labs Reviewed - No data to display  EKG None  Radiology No results found.  Procedures Procedures    Medications Ordered in ED Medications - No data to display  ED Course/ Medical Decision Making/ A&P                             Medical Decision Making Risk Prescription drug management.   This patient is a 52 y.o. female who presents to the ED for concern of dental problem.   Differential diagnoses prior to evaluation: Dental abscess, dental caries, dental fracture, PTA, ludwig's angina  Past Medical History / Social History / Additional history: Chart reviewed. Pertinent results include:  Past Medical History:  Diagnosis Date   No  pertinent past medical history     Physical Exam: Physical exam performed. The pertinent findings include: Normal vital signs, no acute distress.  Tooth decay of the right upper molar no surrounding erythema, no abscess.  No trismus, sublingual or submandibular swelling.  No evidence of PTA or Ludwig's angina.  Disposition: After consideration of the diagnostic results and the patients response to treatment, I feel that emergency department workup does not suggest an emergent condition requiring admission or immediate intervention beyond what has been performed at this time. The plan is: discharge to home with antibiotics, encouraged anti-inflammatories,  prescribed pain medication with zofran.  Patient plans to follow-up with her dentist and oral surgeon next week.  The patient is safe for discharge and has been instructed to return immediately for worsening symptoms, change in symptoms or any other concerns.   Final Clinical Impression(s) / ED Diagnoses Final diagnoses:  Pain, dental    Rx / DC Orders ED Discharge Orders          Ordered    oxyCODONE (ROXICODONE) 5 MG immediate release tablet  Every 4 hours PRN        10/07/22 2342    ondansetron (ZOFRAN) 4 MG tablet  Every 6 hours        10/07/22 2342    clindamycin (CLEOCIN) 150 MG capsule  Every 6 hours        10/07/22 2342           Portions of this report may have been transcribed using voice recognition software. Every effort was made to ensure accuracy; however, inadvertent computerized transcription errors may be present.    Estill Cotta 10/08/22 0010    Margette Fast, MD 10/09/22 [phone]

## 2023-11-29 ENCOUNTER — Other Ambulatory Visit: Payer: Self-pay

## 2023-11-29 ENCOUNTER — Encounter (HOSPITAL_BASED_OUTPATIENT_CLINIC_OR_DEPARTMENT_OTHER): Payer: Self-pay

## 2023-11-29 ENCOUNTER — Emergency Department (HOSPITAL_BASED_OUTPATIENT_CLINIC_OR_DEPARTMENT_OTHER)
Admission: EM | Admit: 2023-11-29 | Discharge: 2023-11-29 | Disposition: A | Discharge status: Home / Self Care | Attending: Emergency Medicine | Admitting: Emergency Medicine

## 2023-11-29 DIAGNOSIS — H6982 Other specified disorders of Eustachian tube, left ear: Secondary | ICD-10-CM | POA: Diagnosis not present

## 2023-11-29 DIAGNOSIS — H9202 Otalgia, left ear: Secondary | ICD-10-CM | POA: Diagnosis present

## 2023-11-29 DIAGNOSIS — H6992 Unspecified Eustachian tube disorder, left ear: Secondary | ICD-10-CM

## 2023-11-29 MED ORDER — IBUPROFEN 800 MG PO TABS
800.0000 mg | ORAL_TABLET | Freq: Once | ORAL | Status: AC
  Administered 2023-11-29: 800 mg via ORAL
  Filled 2023-11-29: qty 1

## 2023-11-29 MED ORDER — TRIAMCINOLONE ACETONIDE 55 MCG/ACT NA AERO: 2.0000 | INHALATION_SPRAY | Freq: Every day | NASAL | 0 refills | Status: AC

## 2023-11-29 MED ORDER — DEXAMETHASONE 4 MG PO TABS
4.0000 mg | ORAL_TABLET | Freq: Once | ORAL | Status: AC
Start: 2023-11-29 — End: 2023-11-29
  Administered 2023-11-29: 4 mg via ORAL
  Filled 2023-11-29: qty 1

## 2023-11-29 NOTE — ED Triage Notes (Signed)
 Patient arrives POV c/o left ear pain. Patient states PCP irrigated left ear d/t cerumen impaction. Patient reports pain started earlier this evening. Denies fever.

## 2023-11-29 NOTE — ED Provider Notes (Signed)
 [place] [facility] Provider Note   CSN: [id number] Arrival date & time: 11/29/23  2251     History  Chief Complaint  Patient presents with   Otalgia    Christine Archer is a 53 y.o. female.  The history is provided by the patient.  Otalgia Location:  Left Behind ear:  No abnormality Severity:  Moderate Timing:  Constant Progression:  Unchanged Chronicity:  New Context: not direct blow and not elevation change   Context comment:  After biking tis evening but ear was irrigated earlier in the day successfully by PMD Relieved by:  Nothing Worsened by:  Nothing Associated symptoms: no fever and no neck pain   Risk factors: no chronic ear infection        Home Medications Prior to Admission medications   Medication Sig Start Date End Date Taking? Authorizing Provider  triamcinolone (NASACORT) 55 MCG/ACT AERO nasal inhaler Place 2 sprays into the nose daily. 11/29/23  Yes Reylene Stauder, MD  clindamycin  (CLEOCIN ) 150 MG capsule Take 1 capsule (150 mg total) by mouth every 6 (six) hours. 10/07/22   Roemhildt, Lorin T, PA-C  ondansetron  (ZOFRAN  ODT) 4 MG disintegrating tablet Take 1 tablet (4 mg total) by mouth every 8 (eight) hours as needed for nausea or vomiting. 04/10/18   Leaphart, Delois Ferrier, PA-C  ondansetron  (ZOFRAN ) 4 MG tablet Take 1 tablet (4 mg total) by mouth every 6 (six) hours. 10/07/22   Roemhildt, Lorin T, PA-C  oxyCODONE  (ROXICODONE ) 5 MG immediate release tablet Take 1 tablet (5 mg total) by mouth every 4 (four) hours as needed for severe pain. 10/07/22   Roemhildt, Lorin T, PA-C  sucralfate  (CARAFATE ) 1 g tablet Take 1 tablet (1 g total) by mouth 4 (four) times daily -  with meals and at bedtime for 14 days. 10/17/20 10/31/20  Cirigliano, Vito V, DO      Allergies    Patient has no known allergies.    Review of Systems   Review of Systems  Constitutional:  Negative for fever.  HENT:  Positive for ear pain.   Musculoskeletal:   Negative for neck pain.  All other systems reviewed and are negative.   Physical Exam Updated Vital Signs Ht 5\' 5"  (1.651 m)   Wt 78 kg   LMP 12/08/2017   BMI 28.62 kg/m  Physical Exam Vitals and nursing note reviewed. Exam conducted with a chaperone present.  Constitutional:      General: She is not in acute distress.    Appearance: Normal appearance. She is well-developed.  HENT:     Head: Normocephalic and atraumatic.     Right Ear: Tympanic membrane and ear canal normal.     Left Ear: Tympanic membrane and ear canal normal.     Ears:     Comments: Pinna in normal position no redness no mastoid swelling or tenderness     Nose: Nose normal.  Eyes:     Pupils: Pupils are equal, round, and reactive to light.  Cardiovascular:     Rate and Rhythm: Normal rate and regular rhythm.     Pulses: Normal pulses.     Heart sounds: Normal heart sounds.  Pulmonary:     Effort: Pulmonary effort is normal. No respiratory distress.     Breath sounds: Normal breath sounds.  Abdominal:     General: Bowel sounds are normal. There is no distension.     Palpations: Abdomen is soft.     Tenderness:  There is no abdominal tenderness. There is no guarding or rebound.  Musculoskeletal:        General: Normal range of motion.     Cervical back: Normal range of motion and neck supple.  Skin:    General: Skin is dry.     Capillary Refill: Capillary refill takes less than 2 seconds.     Findings: No erythema or rash.  Neurological:     General: No focal deficit present.     Mental Status: She is alert.     Deep Tendon Reflexes: Reflexes normal.  Psychiatric:        Mood and Affect: Mood normal.     ED Results / Procedures / Treatments   Labs (all labs ordered are listed, but only abnormal results are displayed) Labs Reviewed - No data to display  EKG None  Radiology No results found.  Procedures Procedures    Medications Ordered in ED Medications  dexamethasone (DECADRON)  tablet 4 mg (has no administration in time range)  ibuprofen  (ADVIL ) tablet 800 mg (has no administration in time range)    ED Course/ Medical Decision Making/ A&P                                 Medical Decision Making Patient with left ear pain post biking   Amount and/or Complexity of Data Reviewed External Data Reviewed: notes.    Details: Previous notes reviewed   Risk OTC drugs. Prescription drug management. Risk Details: Well appearing.  No infection.  No signs of mastoiditis.  Symptoms likely eustachian tube dysfunction.  Dexamethasone provided.  Recommend nasal steroid.  Stable for discharge.  Follow up with PMD for ongoing care.  Strict returns.     Final Clinical Impression(s) / ED Diagnoses Final diagnoses:  Dysfunction of left eustachian tube   No signs of systemic illness or infection. The patient is nontoxic-appearing on exam and vital signs are within normal limits.  I have reviewed the triage vital signs and the nursing notes. Pertinent labs & imaging results that were available during my care of the patient were reviewed by me and considered in my medical decision making (see chart for details). After history, exam, and medical workup I feel the patient has been appropriately medically screened and is safe for discharge home. Pertinent diagnoses were discussed with the patient. Patient was given return precautions.  Rx / DC Orders ED Discharge Orders          Ordered    triamcinolone (NASACORT) 55 MCG/ACT AERO nasal inhaler  Daily        11/29/23 2322              Tracy Gerken, MD 11/29/23 2327

## 2023-12-30 ENCOUNTER — Ambulatory Visit: Payer: Self-pay | Admitting: Allergy
